# Patient Record
Sex: Female | Born: 1985 | Race: Black or African American | Hispanic: No | Marital: Single | Smoking: Former smoker
Health system: Southern US, Community
[De-identification: ages and names within clinical notes are randomized; demographics above are authoritative.]

## PROBLEM LIST (undated history)

## (undated) ENCOUNTER — Inpatient Hospital Stay (HOSPITAL_COMMUNITY): Payer: Self-pay

## (undated) DIAGNOSIS — R519 Headache, unspecified: Secondary | ICD-10-CM

## (undated) DIAGNOSIS — E669 Obesity, unspecified: Secondary | ICD-10-CM

## (undated) DIAGNOSIS — O139 Gestational [pregnancy-induced] hypertension without significant proteinuria, unspecified trimester: Secondary | ICD-10-CM

## (undated) DIAGNOSIS — R87619 Unspecified abnormal cytological findings in specimens from cervix uteri: Secondary | ICD-10-CM

## (undated) DIAGNOSIS — F419 Anxiety disorder, unspecified: Secondary | ICD-10-CM

## (undated) DIAGNOSIS — R51 Headache: Secondary | ICD-10-CM

## (undated) DIAGNOSIS — B009 Herpesviral infection, unspecified: Secondary | ICD-10-CM

## (undated) DIAGNOSIS — A6000 Herpesviral infection of urogenital system, unspecified: Secondary | ICD-10-CM

## (undated) HISTORY — DX: Headache: R51

## (undated) HISTORY — PX: WISDOM TOOTH EXTRACTION: SHX21

## (undated) HISTORY — DX: Headache, unspecified: R51.9

## (undated) HISTORY — DX: Unspecified abnormal cytological findings in specimens from cervix uteri: R87.619

---

## 2013-01-26 ENCOUNTER — Emergency Department (INDEPENDENT_AMBULATORY_CARE_PROVIDER_SITE_OTHER)
Admission: EM | Admit: 2013-01-26 | Discharge: 2013-01-26 | Disposition: A | Discharge status: Home / Self Care | Payer: 59 | Source: Home / Self Care | Attending: Family Medicine | Admitting: Family Medicine

## 2013-01-26 ENCOUNTER — Encounter: Payer: Self-pay | Admitting: *Deleted

## 2013-01-26 DIAGNOSIS — R109 Unspecified abdominal pain: Secondary | ICD-10-CM

## 2013-01-26 DIAGNOSIS — W57XXXA Bitten or stung by nonvenomous insect and other nonvenomous arthropods, initial encounter: Secondary | ICD-10-CM

## 2013-01-26 DIAGNOSIS — Z3490 Encounter for supervision of normal pregnancy, unspecified, unspecified trimester: Secondary | ICD-10-CM

## 2013-01-26 LAB — POCT URINALYSIS DIP (MANUAL ENTRY)
Blood, UA: NEGATIVE
Ketones, POC UA: NEGATIVE
Nitrite, UA: NEGATIVE
Protein Ur, POC: NEGATIVE
Spec Grav, UA: 1.02 (ref 1.005–1.03)
Urobilinogen, UA: 0.2 (ref 0–1)

## 2013-01-26 LAB — POCT URINE PREGNANCY: Preg Test, Ur: POSITIVE

## 2013-01-26 NOTE — ED Provider Notes (Signed)
CSN: [id number]     Arrival date & time 01/26/13  1056 History   First MD Initiated Contact with Patient 01/26/13 1109     Chief Complaint  Patient presents with  . Abdominal Pain  . Abscess    HPI  Patient presents today with chief complaint of intermittent severe abdominal pain x1 week. Patient reports intermittent lower, pain has been progressive he worsening over the course of the week. No fevers or chills. Patient also reports nausea. Patient denies any dysuria but has had some increased urinary frequency as well some questionable diarrhea. Bowel movements have been nonbilious nonbloody. No known trauma. Last mattress cycle is about 7-8 weeks ago. No recent infections. Abdominal pain has woken the patient up from sleep. Patient also has some redness and irritation on the dorsal aspect of her left forearm. Area has been itching. Patient is unsure this is an insect bite. No pain or purulent drainage.  History reviewed. No pertinent past medical history. History reviewed. No pertinent past surgical history. Family History  Problem Relation Age of Onset  . Heart failure Father   . Hypertension Father    History  Substance Use Topics  . Smoking status: Never Smoker   . Smokeless tobacco: Not on file  . Alcohol Use: No   OB History   Grav Para Term Preterm Abortions TAB SAB Ect Mult Living                 Review of Systems  All other systems reviewed and are negative.    Allergies  Review of patient's allergies indicates no known allergies.  Home Medications  No current outpatient prescriptions on file. BP 121/77  Pulse 79  Temp(Src) 98 F (36.7 C) (Oral)  Resp 18  Wt 209 lb (94.802 kg)  SpO2 98%  LMP 12/07/2012 Physical Exam  Constitutional:  Obese  HENT:  Head: Normocephalic and atraumatic.  Eyes: Conjunctivae are normal. Pupils are equal, round, and reactive to light.  Neck: Normal range of motion.  Cardiovascular: Normal rate and regular rhythm.     Pulmonary/Chest: Effort normal and breath sounds normal.  Abdominal: Soft. Bowel sounds are normal.  Positive tenderness palpation in the right lower left lower quadrant. Positive bowel sounds.  Neurological: She is alert.  Skin:     Mild focal area of erythema. Positive blanching. Nontender.    ED Course  Procedures (including critical care time) Labs Review Labs Reviewed  POCT URINE PREGNANCY  POCT URINALYSIS DIP (MANUAL ENTRY)   Imaging Review No results found.  MDM   1. Abdominal  pain, other specified site   2. Pregnancy   3. Insect bite    Urine pregnancy positive on testing today. Given constellation of intermittent progressive abdominal pain as well as pregnancy there is some concern for possible ectopic pregnancy on the differential diagnosis. Had a lengthy discussion with patient. [facility] as patient may benefit from a pelvic and transvaginal ultrasound. Patient feels comfortable to go under her own power. Left area rash consistent with insect bite. No signs of infection. Followup as needed.    The patient and/or caregiver has been counseled thoroughly with regard to treatment plan and/or medications prescribed including dosage, schedule, interactions, rationale for use, and possible side effects and they verbalize understanding. Diagnoses and expected course of recovery discussed and will return if not improved as expected or if the condition worsens. Patient and/or caregiver verbalized understanding.  Doree Albee, MD 01/26/13 1147

## 2013-01-26 NOTE — ED Notes (Signed)
Pt c/o generalized abd pain x 1wk with nausea. She also c/o frequent urination. Denies fever. She also c/o a bump on her LT forearm x 1 day.

## 2013-01-29 ENCOUNTER — Telehealth: Payer: Self-pay | Admitting: Emergency Medicine

## 2013-02-15 ENCOUNTER — Ambulatory Visit (INDEPENDENT_AMBULATORY_CARE_PROVIDER_SITE_OTHER): Payer: 59 | Admitting: Advanced Practice Midwife

## 2013-02-15 ENCOUNTER — Encounter: Payer: Self-pay | Admitting: Advanced Practice Midwife

## 2013-02-15 VITALS — BP 112/85 | Ht 62.0 in | Wt 212.0 lb

## 2013-02-15 DIAGNOSIS — Z8742 Personal history of other diseases of the female genital tract: Secondary | ICD-10-CM

## 2013-02-15 DIAGNOSIS — A6000 Herpesviral infection of urogenital system, unspecified: Secondary | ICD-10-CM

## 2013-02-15 DIAGNOSIS — O30009 Twin pregnancy, unspecified number of placenta and unspecified number of amniotic sacs, unspecified trimester: Secondary | ICD-10-CM

## 2013-02-15 DIAGNOSIS — Z87898 Personal history of other specified conditions: Secondary | ICD-10-CM | POA: Insufficient documentation

## 2013-02-15 DIAGNOSIS — A6009 Herpesviral infection of other urogenital tract: Secondary | ICD-10-CM

## 2013-02-15 DIAGNOSIS — Z113 Encounter for screening for infections with a predominantly sexual mode of transmission: Secondary | ICD-10-CM

## 2013-02-15 DIAGNOSIS — Z3491 Encounter for supervision of normal pregnancy, unspecified, first trimester: Secondary | ICD-10-CM

## 2013-02-15 DIAGNOSIS — O98519 Other viral diseases complicating pregnancy, unspecified trimester: Secondary | ICD-10-CM

## 2013-02-15 DIAGNOSIS — Z124 Encounter for screening for malignant neoplasm of cervix: Secondary | ICD-10-CM

## 2013-02-15 DIAGNOSIS — Z3480 Encounter for supervision of other normal pregnancy, unspecified trimester: Secondary | ICD-10-CM

## 2013-02-15 DIAGNOSIS — Z1151 Encounter for screening for human papillomavirus (HPV): Secondary | ICD-10-CM

## 2013-02-15 DIAGNOSIS — O30001 Twin pregnancy, unspecified number of placenta and unspecified number of amniotic sacs, first trimester: Secondary | ICD-10-CM

## 2013-02-15 MED ORDER — CONCEPT OB 130-92.4-1 MG PO CAPS: 1.0000 | ORAL_CAPSULE | Freq: Every day | ORAL | Status: DC

## 2013-02-15 MED ORDER — VALACYCLOVIR HCL 500 MG PO TABS: 500.0000 mg | ORAL_TABLET | Freq: Two times a day (BID) | ORAL | Status: DC

## 2013-02-15 NOTE — Progress Notes (Signed)
  Subjective:    Pamela Howard is being seen today for her first obstetrical visit.  This is not a planned pregnancy. She is at [redacted]w[redacted]d gestation by LMP, verified by 8 week 3 day ultrasound. [facility] bleeding. 2 gestational sacs seen. 1 with 8 week 3 day fetal pole and positive cardiac activity. Other sac empty. Her obstetrical history is significant for nothing. Relationship with FOB: significant other, living together. Patient does intend to breast feed. Pregnancy history fully reviewed.  Patient reports no complaints.  Review of Systems:   Review of Systems: Negative  Objective:     BP 112/85  Ht 5\' 2"  (1.575 m)  Wt 212 lb (96.163 kg)  BMI 38.77 kg/m2  LMP 12/07/2012 Physical Exam  Exam: General appearance - alert, well appearing, and in no distress, oriented to person, place, and time, overweight, playful, active and well hydrated Mouth - mucous membranes moist, pharynx normal without lesions and dental hygiene good Neck - supple, no significant adenopathy, thyroid exam: thyroid is normal in size without nodules or tenderness Heart - normal rate, regular rhythm, normal S1, S2, no murmurs, rubs, clicks or gallops Abdomen - soft, nontender, nondistended, no masses or organomegaly no CVA tenderness Breasts - breasts appear normal, no suspicious masses, no skin or nipple changes or axillary nodes Pelvic - normal external genitalia, vulva, vagina, cervix, uterus and adnexa, PAP: Pap smear done today Neurological - alert, oriented, normal speech, no focal findings or movement disorder noted, DTR's normal and symmetric Extremities - no edema, redness or tenderness in the calves or thighs     Assessment:    Pregnancy: G2P1001 Supervision of normal pregnancy in first trimester - Plan: Obstetric panel, HIV antibody, Sickle cell screen, Cystic fibrosis diagnostic study, Culture, OB Urine, Alcohol metabolite (ETG), urine, Prescript Monitor Profile(19),  GC/Chlamydia Probe Amp, Cytology - PAP, US Fetal Nuchal Translucency Measurement, Prenat w/o A Vit-FeFum-FePo-FA (CONCEPT OB) 130-92.4-1 MG CAPS  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 Prenatal vitamins. Problem list reviewed and updated. AFP3 discussed: ordered. CF screen ordered. Plans AFP. Role of ultrasound in pregnancy discussed; fetal survey: requested. Amniocentesis discussed: not indicated. Follow up in 4 weeks. 75% of 30 min visit spent on counseling and coordination of care.    Medication List       This list is accurate as of: 02/15/13 11:59 PM.  Always use your most recent med list.               CONCEPT OB 130-92.4-1 MG Caps  Take 1 tablet by mouth daily.     famotidine 20 MG tablet  Commonly known as:  PEPCID  20 mg.        valACYclovir 500 MG tablet  Commonly known as:  VALTREX  Take 1 tablet (500 mg total) by mouth 2 (two) times daily.        Dorathy Kinsman 02/15/2013

## 2013-02-15 NOTE — Patient Instructions (Signed)
Pregnancy - First Trimester  During sexual intercourse, millions of sperm go into the vagina. Only 1 sperm will penetrate and fertilize the female egg while it is in the Fallopian tube. One week later, the fertilized egg implants into the wall of the uterus. An embryo begins to develop into a baby. At 6 to 8 weeks, the eyes and face are formed and the heartbeat can be seen on ultrasound. At the end of 12 weeks (first trimester), all the baby's organs are formed. Now that you are pregnant, you will want to do everything you can to have a healthy baby. Two of the most important things are to get good prenatal care and follow your caregiver's instructions. Prenatal care is all the medical care you receive before the baby's birth. It is given to prevent, find, and treat problems during the pregnancy and childbirth.  PRENATAL EXAMS  · During prenatal visits, your weight, blood pressure, and urine are checked. This is done to make sure you are healthy and progressing normally during the pregnancy.  · A pregnant woman should gain 25 to 35 pounds during the pregnancy. However, if you are overweight or underweight, your caregiver will advise you regarding your weight.  · Your caregiver will ask and answer questions for you.  · Blood work, cervical cultures, other necessary tests, and a Pap test are done during your prenatal exams. These tests are done to check on your health and the probable health of your baby. Tests are strongly recommended and done for HIV with your permission. This is the virus that causes AIDS. These tests are done because medicines can be given to help prevent your baby from being born with this infection should you have been infected without knowing it. Blood work is also used to find out your blood type, previous infections, and follow your blood levels (hemoglobin).  · Low hemoglobin (anemia) is common during pregnancy. Iron and vitamins are given to help prevent this. Later in the pregnancy, blood  tests for diabetes will be done along with any other tests if any problems develop.  · You may need other tests to make sure you and the baby are doing well.  CHANGES DURING THE FIRST TRIMESTER   Your body goes through many changes during pregnancy. They vary from person to person. Talk to your caregiver about changes you notice and are concerned about. Changes can include:  · Your menstrual period stops.  · The egg and sperm carry the genes that determine what you look like. Genes from you and your partner are forming a baby. The female genes determine whether the baby is a boy or a girl.  · Your body increases in girth and you may feel bloated.  · Feeling sick to your stomach (nauseous) and throwing up (vomiting). If the vomiting is uncontrollable, call your caregiver.  · Your breasts will begin to enlarge and become tender.  · Your nipples may stick out more and become darker.  · The need to urinate more. Painful urination may mean you have a bladder infection.  · Tiring easily.  · Loss of appetite.  · Cravings for certain kinds of food.  · At first, you may gain or lose a couple of pounds.  · You may have changes in your emotions from day to day (excited to be pregnant or concerned something may go wrong with the pregnancy and baby).  · You may have more vivid and strange dreams.  HOME CARE INSTRUCTIONS   ·   It is very important to avoid all smoking, alcohol and non-prescribed drugs during your pregnancy. These affect the formation and growth of the baby. Avoid chemicals while pregnant to ensure the delivery of a healthy infant.  · Start your prenatal visits by the 12th week of pregnancy. They are usually scheduled monthly at first, then more often in the last 2 months before delivery. Keep your caregiver's appointments. Follow your caregiver's instructions regarding medicine use, blood and lab tests, exercise, and diet.  · During pregnancy, you are providing food for you and your baby. Eat regular, well-balanced  meals. Choose foods such as meat, fish, milk and other low fat dairy products, vegetables, fruits, and whole-grain breads and cereals. Your caregiver will tell you of the ideal weight gain.  · You can help morning sickness by keeping soda crackers at the bedside. Eat a couple before arising in the morning. You may want to use the crackers without salt on them.  · Eating 4 to 5 small meals rather than 3 large meals a day also may help the nausea and vomiting.  · Drinking liquids between meals instead of during meals also seems to help nausea and vomiting.  · A physical sexual relationship may be continued throughout pregnancy if there are no other problems. Problems may be early (premature) leaking of amniotic fluid from the membranes, vaginal bleeding, or belly (abdominal) pain.  · Exercise regularly if there are no restrictions. Check with your caregiver or physical therapist if you are unsure of the safety of some of your exercises. Greater weight gain will occur in the last 2 trimesters of pregnancy. Exercising will help:  · Control your weight.  · Keep you in shape.  · Prepare you for labor and delivery.  · Help you lose your pregnancy weight after you deliver your baby.  · Wear a good support or jogging bra for breast tenderness during pregnancy. This may help if worn during sleep too.  · Ask when prenatal classes are available. Begin classes when they are offered.  · Do not use hot tubs, steam rooms, or saunas.  · Wear your seat belt when driving. This protects you and your baby if you are in an accident.  · Avoid raw meat, uncooked cheese, cat litter boxes, and soil used by cats throughout the pregnancy. These carry germs that can cause birth defects in the baby.  · The first trimester is a good time to visit your dentist for your dental health. Getting your teeth cleaned is okay. Use a softer toothbrush and brush gently during pregnancy.  · Ask for help if you have financial, counseling, or nutritional needs  during pregnancy. Your caregiver will be able to offer counseling for these needs as well as refer you for other special needs.  · Do not take any medicines or herbs unless told by your caregiver.  · Inform your caregiver if there is any mental or physical domestic violence.  · Make a list of emergency phone numbers of family, friends, hospital, and police and fire departments.  · Write down your questions. Take them to your prenatal visit.  · Do not douche.  · Do not cross your legs.  · If you have to stand for long periods of time, rotate you feet or take small steps in a circle.  · You may have more vaginal secretions that may require a sanitary pad. Do not use tampons or scented sanitary pads.  MEDICINES AND DRUG USE IN PREGNANCY  ·   Take prenatal vitamins as directed. The vitamin should contain 1 milligram of folic acid. Keep all vitamins out of reach of children. Only a couple vitamins or tablets containing iron may be fatal to a baby or young child when ingested.  · Avoid use of all medicines, including herbs, over-the-counter medicines, not prescribed or suggested by your caregiver. Only take over-the-counter or prescription medicines for pain, discomfort, or fever as directed by your caregiver. Do not use aspirin, ibuprofen, or naproxen unless directed by your caregiver.  · Let your caregiver also know about herbs you may be using.  · Alcohol is related to a number of birth defects. This includes fetal alcohol syndrome. All alcohol, in any form, should be avoided completely. Smoking will cause low birth rate and premature babies.  · Street or illegal drugs are very harmful to the baby. They are absolutely forbidden. A baby born to an addicted mother will be addicted at birth. The baby will go through the same withdrawal an adult does.  · Let your caregiver know about any medicines that you have to take and for what reason you take them.  SEEK MEDICAL CARE IF:   You have any concerns or worries during your  pregnancy. It is better to call with your questions if you feel they cannot wait, rather than worry about them.  SEEK IMMEDIATE MEDICAL CARE IF:   · An unexplained oral temperature above 102° F (38.9° C) develops, or as your caregiver suggests.  · You have leaking of fluid from the vagina (birth canal). If leaking membranes are suspected, take your temperature and inform your caregiver of this when you call.  · There is vaginal spotting or bleeding. Notify your caregiver of the amount and how many pads are used.  · You develop a bad smelling vaginal discharge with a change in the color.  · You continue to feel sick to your stomach (nauseated) and have no relief from remedies suggested. You vomit blood or coffee ground-like materials.  · You lose more than 2 pounds of weight in 1 week.  · You gain more than 2 pounds of weight in 1 week and you notice swelling of your face, hands, feet, or legs.  · You gain 5 pounds or more in 1 week (even if you do not have swelling of your hands, face, legs, or feet).  · You get exposed to German measles and have never had them.  · You are exposed to fifth disease or chickenpox.  · You develop belly (abdominal) pain. Round ligament discomfort is a common non-cancerous (benign) cause of abdominal pain in pregnancy. Your caregiver still must evaluate this.  · You develop headache, fever, diarrhea, pain with urination, or shortness of breath.  · You fall or are in a car accident or have any kind of trauma.  · There is mental or physical violence in your home.  Document Released: 04/09/2001 Document Revised: 01/08/2012 Document Reviewed: 10/11/2008  [employer]® Patient Information ©2014 [employer].

## 2013-02-15 NOTE — Progress Notes (Signed)
P - 89 - Pt was seen at [facility] on 02/01/13 for Pelvic Pain - I have rcvd the U/S and Lab results from [facility] and have scanned them into system - Should be able to see results under Media/Lab tabs

## 2013-02-16 LAB — OBSTETRIC PANEL
Antibody Screen: NEGATIVE
Basophils Absolute: 0 10*3/uL (ref 0.0–0.1)
Eosinophils Absolute: 0.1 10*3/uL (ref 0.0–0.7)
Eosinophils Relative: 1 % (ref 0–5)
Hemoglobin: 14 g/dL (ref 12.0–15.0)
MCH: 30.7 pg (ref 26.0–34.0)
MCV: 91.7 fL (ref 78.0–100.0)
Neutrophils Relative %: 64 % (ref 43–77)
Platelets: 340 10*3/uL (ref 150–400)
RDW: 14 % (ref 11.5–15.5)
WBC: 6.9 10*3/uL (ref 4.0–10.5)

## 2013-02-16 LAB — GC/CHLAMYDIA PROBE AMP: GC Probe RNA: NEGATIVE

## 2013-02-16 LAB — HIV ANTIBODY (ROUTINE TESTING W REFLEX): HIV: NONREACTIVE

## 2013-02-16 LAB — SICKLE CELL SCREEN: Sickle Cell Screen: NEGATIVE

## 2013-02-17 LAB — PRESCRIPTION MONITORING PROFILE (19 PANEL)
Amphetamine/Meth: NEGATIVE ng/mL
Barbiturate Screen, Urine: NEGATIVE ng/mL
Benzodiazepine Screen, Urine: NEGATIVE ng/mL
Buprenorphine, Urine: NEGATIVE ng/mL
Carisoprodol, Urine: NEGATIVE ng/mL
MDMA URINE: NEGATIVE ng/mL
Methadone Screen, Urine: NEGATIVE ng/mL
Methaqualone: NEGATIVE ng/mL
Nitrites, Initial: NEGATIVE ug/mL
Opiate Screen, Urine: NEGATIVE ng/mL
Oxycodone Screen, Ur: NEGATIVE ng/mL
Propoxyphene: NEGATIVE ng/mL
Tapentadol, urine: NEGATIVE ng/mL
Tramadol Scrn, Ur: NEGATIVE ng/mL
Zolpidem, Urine: NEGATIVE ng/mL
pH, Initial: 5.9 pH (ref 4.5–8.9)

## 2013-02-17 LAB — CYSTIC FIBROSIS DIAGNOSTIC STUDY

## 2013-02-17 LAB — CANNABANOIDS (GC/LC/MS), URINE: THC-COOH (GC/LC/MS), ur confirm: 33 ng/mL — AB

## 2013-02-17 LAB — CULTURE, OB URINE: Colony Count: 15000

## 2013-02-18 ENCOUNTER — Encounter: Payer: Self-pay | Admitting: Advanced Practice Midwife

## 2013-02-18 DIAGNOSIS — F1290 Cannabis use, unspecified, uncomplicated: Secondary | ICD-10-CM | POA: Insufficient documentation

## 2013-02-18 DIAGNOSIS — A6009 Herpesviral infection of other urogenital tract: Secondary | ICD-10-CM | POA: Insufficient documentation

## 2013-02-18 DIAGNOSIS — Z3490 Encounter for supervision of normal pregnancy, unspecified, unspecified trimester: Secondary | ICD-10-CM | POA: Insufficient documentation

## 2013-02-22 ENCOUNTER — Telehealth: Payer: Self-pay | Admitting: *Deleted

## 2013-02-22 DIAGNOSIS — B373 Candidiasis of vulva and vagina: Secondary | ICD-10-CM

## 2013-02-22 MED ORDER — TERCONAZOLE 0.4 % VA CREA: 1.0000 | TOPICAL_CREAM | Freq: Every day | VAGINAL | Status: DC

## 2013-02-22 NOTE — Telephone Encounter (Signed)
Message copied by Granville Lewis on Mon Feb 22, 2013 11:01 AM ------      Message from: Dorathy Kinsman      Created: Sat Feb 20, 2013  4:31 AM       Pap shows pos Candida. Offer pt Terazol 7 PV as needed if symptomatic. ------

## 2013-02-22 NOTE — Telephone Encounter (Signed)
LM on voicemail  About positive Candida and RX sent to [facility] for Terazol cream per Ivonne Andrew CNM

## 2013-03-03 ENCOUNTER — Ambulatory Visit (HOSPITAL_COMMUNITY): Payer: 59

## 2013-03-08 ENCOUNTER — Ambulatory Visit (HOSPITAL_COMMUNITY)
Admission: RE | Admit: 2013-03-08 | Discharge: 2013-03-08 | Disposition: A | Discharge status: Home / Self Care | Payer: 59 | Source: Ambulatory Visit | Attending: Advanced Practice Midwife | Admitting: Advanced Practice Midwife

## 2013-03-08 VITALS — BP 114/75 | Wt 205.0 lb

## 2013-03-08 DIAGNOSIS — F1290 Cannabis use, unspecified, uncomplicated: Secondary | ICD-10-CM

## 2013-03-08 DIAGNOSIS — Z3491 Encounter for supervision of normal pregnancy, unspecified, first trimester: Secondary | ICD-10-CM

## 2013-03-08 DIAGNOSIS — O351XX0 Maternal care for (suspected) chromosomal abnormality in fetus, not applicable or unspecified: Secondary | ICD-10-CM | POA: Insufficient documentation

## 2013-03-08 DIAGNOSIS — O3510X0 Maternal care for (suspected) chromosomal abnormality in fetus, unspecified, not applicable or unspecified: Secondary | ICD-10-CM | POA: Insufficient documentation

## 2013-03-08 DIAGNOSIS — Z3689 Encounter for other specified antenatal screening: Secondary | ICD-10-CM | POA: Insufficient documentation

## 2013-03-08 NOTE — Progress Notes (Signed)
Denicia Knoche  was seen today for an ultrasound appointment.  See full report in [facility].  Impression: Single IUP at 13 0/7 weeks Normal NT (1.5 mm), Nasal bone present First trimester aneuploidy screen performed as noted above.   Recommendations: Please do not draw triple/quad screen, though patient should be offered MSAFP for neural tube defect screening.  Recommend ultrasound for fetal anatomy at approximately 18 weeks.  Alpha Gula, MD

## 2013-03-12 ENCOUNTER — Other Ambulatory Visit: Payer: Self-pay

## 2013-03-15 ENCOUNTER — Encounter: Payer: 59 | Admitting: Advanced Practice Midwife

## 2013-03-16 ENCOUNTER — Encounter: Payer: Self-pay | Admitting: Advanced Practice Midwife

## 2013-03-16 ENCOUNTER — Encounter: Payer: Self-pay | Admitting: *Deleted

## 2013-03-21 ENCOUNTER — Encounter (HOSPITAL_COMMUNITY): Payer: Self-pay | Admitting: *Deleted

## 2013-03-21 ENCOUNTER — Inpatient Hospital Stay (HOSPITAL_COMMUNITY)
Admission: AD | Admit: 2013-03-21 | Discharge: 2013-03-21 | Disposition: A | Discharge status: Home / Self Care | Payer: 59 | Source: Ambulatory Visit | Attending: Obstetrics & Gynecology | Admitting: Obstetrics & Gynecology

## 2013-03-21 DIAGNOSIS — O26899 Other specified pregnancy related conditions, unspecified trimester: Secondary | ICD-10-CM

## 2013-03-21 DIAGNOSIS — R109 Unspecified abdominal pain: Secondary | ICD-10-CM | POA: Insufficient documentation

## 2013-03-21 DIAGNOSIS — O99891 Other specified diseases and conditions complicating pregnancy: Secondary | ICD-10-CM | POA: Insufficient documentation

## 2013-03-21 LAB — URINALYSIS, ROUTINE W REFLEX MICROSCOPIC
Bilirubin Urine: NEGATIVE
Glucose, UA: NEGATIVE mg/dL
Ketones, ur: NEGATIVE mg/dL
Nitrite: NEGATIVE
Specific Gravity, Urine: 1.025 (ref 1.005–1.030)
Urobilinogen, UA: 1 mg/dL (ref 0.0–1.0)

## 2013-03-21 LAB — POCT PREGNANCY, URINE: Preg Test, Ur: POSITIVE — AB

## 2013-03-21 NOTE — MAU Provider Note (Signed)
History     CSN: [id number]  Arrival date and time: 03/21/13 2124   First Provider Initiated Contact with Patient 03/21/13 2211      Chief Complaint  Patient presents with  . Abdominal Cramping   HPI  Pt is a 27 yo G2P1001 at [redacted]w[redacted]d wk IUP here with report of lower abdominal pain that started last night.  Pain is in the lower pelvis, radiates to left side, and described as cramping.  Pain increases with coughing.  No report of vaginal bleeding or leaking of fluid.  Denies UTI symptoms or abnormal vaginal discharge.    History reviewed. No pertinent past medical history.  History reviewed. No pertinent past surgical history.  Family History  Problem Relation Age of Onset  . Heart failure Father   . Hypertension Father     History  Substance Use Topics  . Smoking status: Never Smoker   . Smokeless tobacco: Not on file  . Alcohol Use: No    Allergies: No Known Allergies  Prescriptions prior to admission  Medication Sig Dispense Refill  . acetaminophen (TYLENOL) 500 MG tablet Take 1,000 mg by mouth every 6 (six) hours as needed for mild pain.      Burnis Medin w/o A Vit-FeFum-FePo-FA (CONCEPT OB) 130-92.4-1 MG CAPS Take 1 tablet by mouth daily.  30 capsule  12    Review of Systems  Gastrointestinal: Positive for abdominal pain (cramping).  Genitourinary: Negative for dysuria, urgency and frequency.  Musculoskeletal: Negative for back pain.  All other systems reviewed and are negative.   Physical Exam   Blood pressure 128/72, pulse 92, temperature 98.6 F (37 C), temperature source Oral, resp. rate 20, last menstrual period 12/07/2012.  Physical Exam  Constitutional: She is oriented to person, place, and time. She appears well-developed and well-nourished. No distress.  HENT:  Head: Normocephalic.  Neck: Normal range of motion. Neck supple.  Cardiovascular: Normal rate, regular rhythm and normal heart sounds.   Respiratory: Effort normal and breath sounds normal.    GI: Soft. She exhibits no mass. There is tenderness (LUQ) in the left upper quadrant.    Genitourinary: No bleeding around the vagina. Vaginal discharge (mucusy ) found.  Musculoskeletal: Normal range of motion. She exhibits no edema.  Neurological: She is alert and oriented to person, place, and time.  Skin: Skin is warm and dry.     Dilation: Closed Effacement (%): Thick Cervical Position: Posterior Exam by:: Roney Marion, CNM  MAU Course  Procedures Results for orders placed during the hospital encounter of 03/21/13 (from the past 24 hour(s))  URINALYSIS, ROUTINE W REFLEX MICROSCOPIC     Status: None   Collection Time    03/21/13  9:43 PM      Result Value Range   Color, Urine YELLOW  YELLOW   APPearance CLEAR  CLEAR   Specific Gravity, Urine 1.025  1.005 - 1.030   pH 6.5  5.0 - 8.0   Glucose, UA NEGATIVE  NEGATIVE mg/dL   Hgb urine dipstick NEGATIVE  NEGATIVE   Bilirubin Urine NEGATIVE  NEGATIVE   Ketones, ur NEGATIVE  NEGATIVE mg/dL   Protein, ur NEGATIVE  NEGATIVE mg/dL   Urobilinogen, UA 1.0  0.0 - 1.0 mg/dL   Nitrite NEGATIVE  NEGATIVE   Leukocytes, UA NEGATIVE  NEGATIVE  POCT PREGNANCY, URINE     Status: Abnormal   Collection Time    03/21/13  9:47 PM      Result Value Range   Preg Test, Ur  POSITIVE (*) NEGATIVE   FHR 150  Assessment and Plan  27 yo G2P1001 at [redacted]w[redacted]d wks IUP Abdominal Pain - Normal Exam  Plan: Discharge to home Pregnancy precautions reviewed Keep scheduled appointment for this week.  [facility] 03/21/2013, 10:14 PM

## 2013-03-21 NOTE — MAU Note (Signed)
Abdominal cramping off and on since last night. Denies vaginal bleeding, leaking of fluid and abnormal vaginal discharge.

## 2013-03-22 NOTE — MAU Provider Note (Signed)
Attestation of Attending Supervision of Advanced Practitioner (CNM/NP): Evaluation and management procedures were performed by the Advanced Practitioner under my supervision and collaboration. I have reviewed the Advanced Practitioner's note and chart, and I agree with the management and plan.  Jozie Wulf H. 6:23 AM   

## 2013-03-23 ENCOUNTER — Ambulatory Visit (INDEPENDENT_AMBULATORY_CARE_PROVIDER_SITE_OTHER): Payer: 59 | Admitting: Obstetrics & Gynecology

## 2013-03-23 VITALS — BP 122/75 | Wt 211.0 lb

## 2013-03-23 DIAGNOSIS — Z3402 Encounter for supervision of normal first pregnancy, second trimester: Secondary | ICD-10-CM

## 2013-03-23 DIAGNOSIS — Z3480 Encounter for supervision of other normal pregnancy, unspecified trimester: Secondary | ICD-10-CM

## 2013-03-23 DIAGNOSIS — Z3492 Encounter for supervision of normal pregnancy, unspecified, second trimester: Secondary | ICD-10-CM

## 2013-03-23 MED ORDER — RANITIDINE HCL 150 MG PO TABS: 150.0000 mg | ORAL_TABLET | Freq: Two times a day (BID) | ORAL | Status: DC

## 2013-03-23 MED ORDER — VALACYCLOVIR HCL 500 MG PO TABS: 500.0000 mg | ORAL_TABLET | Freq: Every day | ORAL | Status: DC

## 2013-03-23 MED ORDER — FLUCONAZOLE 100 MG PO TABS: 150.0000 mg | ORAL_TABLET | Freq: Every day | ORAL | Status: DC

## 2013-03-23 NOTE — Progress Notes (Signed)
p-95  Increase in headaches since pregnancy.  Pain and numbness in Rt butt cheek.

## 2013-03-23 NOTE — Progress Notes (Signed)
AFP 

## 2013-03-23 NOTE — Addendum Note (Signed)
Addended by: Granville Lewis on: 03/23/2013 04:38 PM   Modules accepted: Orders

## 2013-03-29 LAB — ALPHA FETOPROTEIN, MATERNAL
MoM for AFP: 1.15
Open Spina bifida: NEGATIVE

## 2013-03-30 ENCOUNTER — Ambulatory Visit (INDEPENDENT_AMBULATORY_CARE_PROVIDER_SITE_OTHER): Payer: 59 | Admitting: Nurse Practitioner

## 2013-03-30 ENCOUNTER — Encounter: Payer: Self-pay | Admitting: Nurse Practitioner

## 2013-03-30 ENCOUNTER — Encounter: Payer: Self-pay | Admitting: Obstetrics & Gynecology

## 2013-03-30 ENCOUNTER — Institutional Professional Consult (permissible substitution): Payer: 59 | Admitting: Nurse Practitioner

## 2013-03-30 VITALS — BP 115/74 | HR 96 | Resp 16 | Wt 212.0 lb

## 2013-03-30 DIAGNOSIS — F419 Anxiety disorder, unspecified: Secondary | ICD-10-CM | POA: Insufficient documentation

## 2013-03-30 DIAGNOSIS — G43009 Migraine without aura, not intractable, without status migrainosus: Secondary | ICD-10-CM | POA: Insufficient documentation

## 2013-03-30 DIAGNOSIS — F411 Generalized anxiety disorder: Secondary | ICD-10-CM

## 2013-03-30 MED ORDER — PROMETHAZINE HCL 25 MG PO TABS: 25.0000 mg | ORAL_TABLET | Freq: Four times a day (QID) | ORAL | Status: DC | PRN

## 2013-03-30 MED ORDER — SUMATRIPTAN SUCCINATE 100 MG PO TABS: 100.0000 mg | ORAL_TABLET | ORAL | Status: DC | PRN

## 2013-03-30 MED ORDER — CYCLOBENZAPRINE HCL 10 MG PO TABS: 10.0000 mg | ORAL_TABLET | Freq: Two times a day (BID) | ORAL | Status: DC | PRN

## 2013-03-30 NOTE — Progress Notes (Signed)
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 She has had migraines since her teen years. Her grandmother and sister have migraines. She denies aura. She has vertigo with her migraines. 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 She lives with and has a 55 yo daughter with the FOB.  She is often stressed with life events. Her migraines have been everyday since she was [redacted] weeks pregnant.    Location: Right and left temple  Number of Headache days/month: Daily Severe: 2 Moderate: everyday Mild:0  Current Outpatient Prescriptions on File Prior to Visit  Medication Sig Dispense Refill  . acetaminophen (TYLENOL) 500 MG tablet Take 1,000 mg by mouth every 6 (six) hours as needed for mild pain.      . fluconazole (DIFLUCAN) 100 MG tablet Take 1.5 tablets (150 mg total) by mouth daily.  1 tablet  0  . Prenat w/o A Vit-FeFum-FePo-FA (CONCEPT OB) 130-92.4-1 MG CAPS Take 1 tablet by mouth daily.  30 capsule  12  . ranitidine (ZANTAC) 150 MG tablet Take 1 tablet (150 mg total) by mouth 2 (two) times daily.  60 tablet  1  . valACYclovir (VALTREX) 500 MG tablet Take 1 tablet (500 mg total) by mouth daily. Can increase to twice a day for 5 days in the event of a recurrence  30 tablet  12   No current facility-administered medications on file prior to visit.    Acute / prevention: Motrin, tylenol,   Past Medical History  Diagnosis Date  . Headache    History reviewed. No pertinent past surgical history. Family History  Problem Relation Age of Onset  . Heart failure Father   . Hypertension Father   . Migraines Sister   . Migraines Paternal Grandmother    Social History:  reports that she has never smoked. She does not have any smokeless tobacco history on file. She reports that she does not drink alcohol or use illicit drugs. Allergies: No Known  Allergies  Triggers: Heat, stress, allergies seasonal  Birth control: Nexplanon after delivery  ROS: Positive for migraine, anxiety, seasonal allergies, nausea, sciatica pains, obesoty, pregnancy  Exam: Well developed 27 YO AA Female who is pregnant  General: NAD HEENT: Negative Cardiac: RRR Lungs: Clear Neuro: Negative Skin: Warm and dry  Procedure: 2cc lidocaine, 2 cc marcaine, 1 cc dexamethazone. Injected with 1cc each site in Bil Occipital. Pt tolerated procedure well.    Impression: chronic migraine in pregnancy  Plan: Discussed the pathophysiology of migraine and risk benefits of medications. We have decided upon Flexeril for prevention which may also help sciatic pains. She will use Imitrex and phenergan when she gets a migraine. We used trigger point injections today to help bridge her over. She had relief from her migraine after TPI.  We discussed ways to control her anxiety that are nonmedication in nature. She will RTC 4 weeks or prn.   Time Spent: 45 minutes

## 2013-03-30 NOTE — Patient Instructions (Signed)
Migraine Headache A migraine headache is an intense, throbbing pain on one or both sides of your head. A migraine can last for 30 minutes to several hours. CAUSES  The exact cause of a migraine headache is not always known. However, a migraine may be caused when nerves in the brain become irritated and release chemicals that cause inflammation. This causes pain. SYMPTOMS  Pain on one or both sides of your head.  Pulsating or throbbing pain.  Severe pain that prevents daily activities.  Pain that is aggravated by any physical activity.  Nausea, vomiting, or both.  Dizziness.  Pain with exposure to bright lights, loud noises, or activity.  General sensitivity to bright lights, loud noises, or smells. Before you get a migraine, you may get warning signs that a migraine is coming (aura). An aura may include:  Seeing flashing lights.  Seeing bright spots, halos, or zig-zag lines.  Having tunnel vision or blurred vision.  Having feelings of numbness or tingling.  Having trouble talking.  Having muscle weakness. MIGRAINE TRIGGERS  Alcohol.  Smoking.  Stress.  Menstruation.  Aged cheeses.  Foods or drinks that contain nitrates, glutamate, aspartame, or tyramine.  Lack of sleep.  Chocolate.  Caffeine.  Hunger.  Physical exertion.  Fatigue.  Medicines used to treat chest pain (nitroglycerine), birth control pills, estrogen, and some blood pressure medicines. DIAGNOSIS  A migraine headache is often diagnosed based on:  Symptoms.  Physical examination.  A CT scan or MRI of your head. TREATMENT Medicines may be given for pain and nausea. Medicines can also be given to help prevent recurrent migraines.  HOME CARE INSTRUCTIONS  Only take over-the-counter or prescription medicines for pain or discomfort as directed by your caregiver. The use of long-term narcotics is not recommended.  Lie down in a dark, quiet room when you have a migraine.  Keep a journal  to find out what may trigger your migraine headaches. For example, write down:  What you eat and drink.  How much sleep you get.  Any change to your diet or medicines.  Limit alcohol consumption.  Quit smoking if you smoke.  Get 7 to 9 hours of sleep, or as recommended by your caregiver.  Limit stress.  Keep lights dim if bright lights bother you and make your migraines worse. SEEK IMMEDIATE MEDICAL CARE IF:   Your migraine becomes severe.  You have a fever.  You have a stiff neck.  You have vision loss.  You have muscular weakness or loss of muscle control.  You start losing your balance or have trouble walking.  You feel faint or pass out.  You have severe symptoms that are different from your first symptoms. MAKE SURE YOU:   Understand these instructions.  Will watch your condition.  Will get help right away if you are not doing well or get worse. Document Released: 04/15/2005 Document Revised: 07/08/2011 Document Reviewed: 04/05/2011 ExitCare Patient Information 2014 [employer].  

## 2013-03-31 ENCOUNTER — Telehealth: Payer: Self-pay | Admitting: *Deleted

## 2013-03-31 DIAGNOSIS — Z3481 Encounter for supervision of other normal pregnancy, first trimester: Secondary | ICD-10-CM

## 2013-03-31 MED ORDER — CONCEPT OB 130-92.4-1 MG PO CAPS: 1.0000 | ORAL_CAPSULE | Freq: Every day | ORAL | Status: DC

## 2013-03-31 NOTE — Telephone Encounter (Signed)
Pt called adv needs prenatal vitamins sent to [facility] - will cancel current order and place new order with correct pharm

## 2013-04-12 ENCOUNTER — Ambulatory Visit (HOSPITAL_COMMUNITY)
Admission: RE | Admit: 2013-04-12 | Discharge: 2013-04-12 | Disposition: A | Discharge status: Home / Self Care | Payer: 59 | Source: Ambulatory Visit | Attending: Obstetrics & Gynecology | Admitting: Obstetrics & Gynecology

## 2013-04-12 ENCOUNTER — Other Ambulatory Visit: Payer: Self-pay | Admitting: Obstetrics & Gynecology

## 2013-04-12 VITALS — BP 116/70 | HR 108 | Wt 212.0 lb

## 2013-04-12 DIAGNOSIS — Z3689 Encounter for other specified antenatal screening: Secondary | ICD-10-CM

## 2013-04-12 DIAGNOSIS — Z3492 Encounter for supervision of normal pregnancy, unspecified, second trimester: Secondary | ICD-10-CM

## 2013-04-12 DIAGNOSIS — F1290 Cannabis use, unspecified, uncomplicated: Secondary | ICD-10-CM

## 2013-04-13 ENCOUNTER — Encounter: Payer: Self-pay | Admitting: Obstetrics & Gynecology

## 2013-04-26 ENCOUNTER — Encounter: Payer: 59 | Admitting: Advanced Practice Midwife

## 2013-04-28 ENCOUNTER — Ambulatory Visit (INDEPENDENT_AMBULATORY_CARE_PROVIDER_SITE_OTHER): Payer: 59 | Admitting: Obstetrics & Gynecology

## 2013-04-28 ENCOUNTER — Encounter: Payer: Self-pay | Admitting: Obstetrics & Gynecology

## 2013-04-28 VITALS — BP 103/70 | Temp 97.9°F | Wt 211.0 lb

## 2013-04-28 DIAGNOSIS — Z3480 Encounter for supervision of other normal pregnancy, unspecified trimester: Secondary | ICD-10-CM

## 2013-04-28 DIAGNOSIS — Z3492 Encounter for supervision of normal pregnancy, unspecified, second trimester: Secondary | ICD-10-CM

## 2013-04-28 DIAGNOSIS — R0989 Other specified symptoms and signs involving the circulatory and respiratory systems: Secondary | ICD-10-CM

## 2013-04-28 NOTE — Progress Notes (Signed)
p=[phone] 

## 2013-04-28 NOTE — Patient Instructions (Signed)
Return to clinic for any obstetric concerns or go to [facility] for evaluation  

## 2013-04-28 NOTE — Progress Notes (Signed)
Reports respiratory symptoms, fevers to 103 at home.  Here temperature is 97.9.  Lungs are clear on exam. She was told to return to Urgent care for any further fevers as she may need testing for Flu and further treatment.  For now, drinking plenty of fluids and OTC meds recommended for symptomatic relief.  No other complaints or concerns.  Routine obstetric precautions reviewed.

## 2013-05-04 ENCOUNTER — Encounter: Payer: 59 | Admitting: Nurse Practitioner

## 2013-05-24 ENCOUNTER — Encounter: Payer: Self-pay | Admitting: Advanced Practice Midwife

## 2013-05-24 ENCOUNTER — Ambulatory Visit (INDEPENDENT_AMBULATORY_CARE_PROVIDER_SITE_OTHER): Payer: 59 | Admitting: Advanced Practice Midwife

## 2013-05-24 VITALS — BP 132/73 | Wt 217.0 lb

## 2013-05-24 DIAGNOSIS — Z3480 Encounter for supervision of other normal pregnancy, unspecified trimester: Secondary | ICD-10-CM

## 2013-05-24 DIAGNOSIS — Z3491 Encounter for supervision of normal pregnancy, unspecified, first trimester: Secondary | ICD-10-CM

## 2013-05-24 NOTE — Progress Notes (Signed)
p-[phone]

## 2013-05-24 NOTE — Patient Instructions (Signed)
Second Trimester of Pregnancy The second trimester is from week 13 through week 28, months 4 through 6. The second trimester is often a time when you feel your best. Your body has also adjusted to being pregnant, and you begin to feel better physically. Usually, morning sickness has lessened or quit completely, you may have more energy, and you may have an increase in appetite. The second trimester is also a time when the fetus is growing rapidly. At the end of the sixth month, the fetus is about 9 inches long and weighs about 1 pounds. You will likely begin to feel the baby move (quickening) between 18 and 20 weeks of the pregnancy. BODY CHANGES Your body goes through many changes during pregnancy. The changes vary from woman to woman.   Your weight will continue to increase. You will notice your lower abdomen bulging out.  You may begin to get stretch marks on your hips, abdomen, and breasts.  You may develop headaches that can be relieved by medicines approved by your caregiver.  You may urinate more often because the fetus is pressing on your bladder.  You may develop or continue to have heartburn as a result of your pregnancy.  You may develop constipation because certain hormones are causing the muscles that push waste through your intestines to slow down.  You may develop hemorrhoids or swollen, bulging veins (varicose veins).  You may have back pain because of the weight gain and pregnancy hormones relaxing your joints between the bones in your pelvis and as a result of a shift in weight and the muscles that support your balance.  Your breasts will continue to grow and be tender.  Your gums may bleed and may be sensitive to brushing and flossing.  Dark spots or blotches (chloasma, mask of pregnancy) may develop on your face. This will likely fade after the baby is born.  A dark line from your belly button to the pubic area (linea nigra) may appear. This will likely fade after the  baby is born. WHAT TO EXPECT AT YOUR PRENATAL VISITS During a routine prenatal visit:  You will be weighed to make sure you and the fetus are growing normally.  Your blood pressure will be taken.  Your abdomen will be measured to track your baby's growth.  The fetal heartbeat will be listened to.  Any test results from the previous visit will be discussed. Your caregiver may ask you:  How you are feeling.  If you are feeling the baby move.  If you have had any abnormal symptoms, such as leaking fluid, bleeding, severe headaches, or abdominal cramping.  If you have any questions. Other tests that may be performed during your second trimester include:  Blood tests that check for:  Low iron levels (anemia).  Gestational diabetes (between 24 and 28 weeks).  Rh antibodies.  Urine tests to check for infections, diabetes, or protein in the urine.  An ultrasound to confirm the proper growth and development of the baby.  An amniocentesis to check for possible genetic problems.  Fetal screens for spina bifida and Down syndrome. HOME CARE INSTRUCTIONS   Avoid all smoking, herbs, alcohol, and unprescribed drugs. These chemicals affect the formation and growth of the baby.  Follow your caregiver's instructions regarding medicine use. There are medicines that are either safe or unsafe to take during pregnancy.  Exercise only as directed by your caregiver. Experiencing uterine cramps is a good sign to stop exercising.  Continue to eat regular,   healthy meals.  Wear a good support bra for breast tenderness.  Do not use hot tubs, steam rooms, or saunas.  Wear your seat belt at all times when driving.  Avoid raw meat, uncooked cheese, cat litter boxes, and soil used by cats. These carry germs that can cause birth defects in the baby.  Take your prenatal vitamins.  Try taking a stool softener (if your caregiver approves) if you develop constipation. Eat more high-fiber foods,  such as fresh vegetables or fruit and whole grains. Drink plenty of fluids to keep your urine clear or pale yellow.  Take warm sitz baths to soothe any pain or discomfort caused by hemorrhoids. Use hemorrhoid cream if your caregiver approves.  If you develop varicose veins, wear support hose. Elevate your feet for 15 minutes, 3 4 times a day. Limit salt in your diet.  Avoid heavy lifting, wear low heel shoes, and practice good posture.  Rest with your legs elevated if you have leg cramps or low back pain.  Visit your dentist if you have not gone yet during your pregnancy. Use a soft toothbrush to brush your teeth and be gentle when you floss.  A sexual relationship may be continued unless your caregiver directs you otherwise.  Continue to go to all your prenatal visits as directed by your caregiver. SEEK MEDICAL CARE IF:   You have dizziness.  You have mild pelvic cramps, pelvic pressure, or nagging pain in the abdominal area.  You have persistent nausea, vomiting, or diarrhea.  You have a bad smelling vaginal discharge.  You have pain with urination. SEEK IMMEDIATE MEDICAL CARE IF:   You have a fever.  You are leaking fluid from your vagina.  You have spotting or bleeding from your vagina.  You have severe abdominal cramping or pain.  You have rapid weight gain or loss.  You have shortness of breath with chest pain.  You notice sudden or extreme swelling of your face, hands, ankles, feet, or legs.  You have not felt your baby move in over an hour.  You have severe headaches that do not go away with medicine.  You have vision changes. Document Released: 04/09/2001 Document Revised: 12/16/2012 Document Reviewed: 06/16/2012 ExitCare Patient Information 2014 [employer].  

## 2013-05-24 NOTE — Progress Notes (Signed)
Doing well. Lots of fetal movement. Moving, so life is hectic right now.

## 2013-06-01 ENCOUNTER — Encounter: Payer: Self-pay | Admitting: Nurse Practitioner

## 2013-06-01 ENCOUNTER — Ambulatory Visit (INDEPENDENT_AMBULATORY_CARE_PROVIDER_SITE_OTHER): Payer: 59 | Admitting: Nurse Practitioner

## 2013-06-01 ENCOUNTER — Other Ambulatory Visit: Payer: Self-pay | Admitting: *Deleted

## 2013-06-01 VITALS — BP 119/77 | HR 104 | Resp 16 | Ht 62.0 in | Wt 219.0 lb

## 2013-06-01 DIAGNOSIS — E669 Obesity, unspecified: Secondary | ICD-10-CM | POA: Insufficient documentation

## 2013-06-01 DIAGNOSIS — G43019 Migraine without aura, intractable, without status migrainosus: Secondary | ICD-10-CM

## 2013-06-01 DIAGNOSIS — Z3480 Encounter for supervision of other normal pregnancy, unspecified trimester: Secondary | ICD-10-CM

## 2013-06-01 MED ORDER — CONCEPT OB 130-92.4-1 MG PO CAPS
1.0000 | ORAL_CAPSULE | Freq: Every day | ORAL | Status: DC
Start: 2013-06-01 — End: 2013-06-23

## 2013-06-01 NOTE — Patient Instructions (Signed)
Migraine Headache A migraine headache is an intense, throbbing pain on one or both sides of your head. A migraine can last for 30 minutes to several hours. CAUSES  The exact cause of a migraine headache is not always known. However, a migraine may be caused when nerves in the brain become irritated and release chemicals that cause inflammation. This causes pain. Certain things may also trigger migraines, such as:  Alcohol.  Smoking.  Stress.  Menstruation.  Aged cheeses.  Foods or drinks that contain nitrates, glutamate, aspartame, or tyramine.  Lack of sleep.  Chocolate.  Caffeine.  Hunger.  Physical exertion.  Fatigue.  Medicines used to treat chest pain (nitroglycerine), birth control pills, estrogen, and some blood pressure medicines. SIGNS AND SYMPTOMS  Pain on one or both sides of your head.  Pulsating or throbbing pain.  Severe pain that prevents daily activities.  Pain that is aggravated by any physical activity.  Nausea, vomiting, or both.  Dizziness.  Pain with exposure to bright lights, loud noises, or activity.  General sensitivity to bright lights, loud noises, or smells. Before you get a migraine, you may get warning signs that a migraine is coming (aura). An aura may include:  Seeing flashing lights.  Seeing bright spots, halos, or zig-zag lines.  Having tunnel vision or blurred vision.  Having feelings of numbness or tingling.  Having trouble talking.  Having muscle weakness. DIAGNOSIS  A migraine headache is often diagnosed based on:  Symptoms.  Physical exam.  A CT scan or MRI of your head. These imaging tests cannot diagnose migraines, but they can help rule out other causes of headaches. TREATMENT Medicines may be given for pain and nausea. Medicines can also be given to help prevent recurrent migraines.  HOME CARE INSTRUCTIONS  Only take over-the-counter or prescription medicines for pain or discomfort as directed by your  health care provider. The use of long-term narcotics is not recommended.  Lie down in a dark, quiet room when you have a migraine.  Keep a journal to find out what may trigger your migraine headaches. For example, write down:  What you eat and drink.  How much sleep you get.  Any change to your diet or medicines.  Limit alcohol consumption.  Quit smoking if you smoke.  Get 7 9 hours of sleep, or as recommended by your health care provider.  Limit stress.  Keep lights dim if bright lights bother you and make your migraines worse. SEEK IMMEDIATE MEDICAL CARE IF:   Your migraine becomes severe.  You have a fever.  You have a stiff neck.  You have vision loss.  You have muscular weakness or loss of muscle control.  You start losing your balance or have trouble walking.  You feel faint or pass out.  You have severe symptoms that are different from your first symptoms. MAKE SURE YOU:   Understand these instructions.  Will watch your condition.  Will get help right away if you are not doing well or get worse. Document Released: 04/15/2005 Document Revised: 02/03/2013 Document Reviewed: 12/21/2012 ExitCare Patient Information 2014 [employer].  

## 2013-06-01 NOTE — Progress Notes (Signed)
History:  Pamela Howard is a 28 y.o. G2P1001 who presents to [place] [facility] today for follow up on her migraines in pregnancy. She is doing much better. The trigger point injections were very helpful in breaking the headache cycle and she did not even have a headache for almost 2 weeks after. Since then she has been using the Imitrex and Flexeril for migraine control. For some reason she did not get phenergan filled. She will go back to pharmacy and pick that medication up. She has moved into her new house and things are less stressful.   The following portions of the patient's history were reviewed and updated as appropriate: allergies, current medications, past family history, past medical history, past social history, past surgical history and problem list.  Review of Systems:  Pertinent items are noted in HPI.  Objective:  Physical Exam BP 119/77  Pulse 104  Resp 16  Ht 5\' 2"  (1.575 m)  Wt 219 lb (99.338 kg)  BMI 40.05 kg/m2  LMP 12/07/2012 GENERAL: Well-developed, well-nourished female in no acute distress.  HEENT: Normocephalic, atraumatic.  NECK: Supple. Normal thyroid.  LUNGS: Normal rate. Clear to auscultation bilaterally.  HEART: Regular rate and rhythm with no adventitious sounds.  EXTREMITIES: No cyanosis, clubbing, or edema, 2+ distal pulses.   Labs and Imaging No results found.  Assessment & Plan:  Assessment:  Migraine in pregnancy  Plans:  We reviewed all of her medications and how they are to be used She will pick up phenergan and add that as needed She is clearly doing very well and can return on prn basis  Delbert PhenixLinda M Sanye Ledesma, NP 06/01/2013 9:25 AM

## 2013-06-17 ENCOUNTER — Other Ambulatory Visit: Payer: Self-pay | Admitting: Nurse Practitioner

## 2013-06-21 ENCOUNTER — Other Ambulatory Visit: Payer: Self-pay | Admitting: *Deleted

## 2013-06-21 ENCOUNTER — Encounter: Payer: 59 | Admitting: Advanced Practice Midwife

## 2013-06-21 DIAGNOSIS — G43909 Migraine, unspecified, not intractable, without status migrainosus: Secondary | ICD-10-CM

## 2013-06-21 MED ORDER — CYCLOBENZAPRINE HCL 10 MG PO TABS: 10.0000 mg | ORAL_TABLET | Freq: Two times a day (BID) | ORAL | Status: DC | PRN

## 2013-06-21 NOTE — Telephone Encounter (Signed)
RF authorization sent to [facility] for Flexeril 10 mg per Jannifer RodneyLinda Barefoot, NP

## 2013-06-23 ENCOUNTER — Ambulatory Visit (INDEPENDENT_AMBULATORY_CARE_PROVIDER_SITE_OTHER): Payer: 59 | Admitting: Advanced Practice Midwife

## 2013-06-23 ENCOUNTER — Encounter: Payer: Self-pay | Admitting: Advanced Practice Midwife

## 2013-06-23 VITALS — BP 106/68 | Wt 222.0 lb

## 2013-06-23 DIAGNOSIS — Z3480 Encounter for supervision of other normal pregnancy, unspecified trimester: Secondary | ICD-10-CM

## 2013-06-23 DIAGNOSIS — G43909 Migraine, unspecified, not intractable, without status migrainosus: Secondary | ICD-10-CM

## 2013-06-23 MED ORDER — PRENATAL FORMULA 28-0.8-235 MG PO CAPS: 1.0000 | ORAL_CAPSULE | Freq: Every day | ORAL | Status: DC

## 2013-06-23 MED ORDER — CYCLOBENZAPRINE HCL 10 MG PO TABS: 5.0000 mg | ORAL_TABLET | Freq: Three times a day (TID) | ORAL | Status: DC | PRN

## 2013-06-23 NOTE — Progress Notes (Signed)
p-109

## 2013-06-23 NOTE — Progress Notes (Signed)
Doing well.  Good fetal movement, denies vaginal bleeding, LOF, regular contractions. Reports occasional back spasms.  Was on Flexeril for h/a, helps with migraines and back pain but needs refill.  Flexeril 5-10 mg TID PRN sent to pharmacy.  Pt reported her PNV was $18 because of brand name.  Switched PNV, instructed pt to call or have pharmacy call if Rx not covered by insurance.  Glucose screen done today.

## 2013-06-24 LAB — CBC
HCT: 37.1 % (ref 36.0–46.0)
HEMOGLOBIN: 12.5 g/dL (ref 12.0–15.0)
MCH: 30.6 pg (ref 26.0–34.0)
MCHC: 33.7 g/dL (ref 30.0–36.0)
MCV: 90.7 fL (ref 78.0–100.0)
PLATELETS: 296 10*3/uL (ref 150–400)
RBC: 4.09 MIL/uL (ref 3.87–5.11)
RDW: 14.2 % (ref 11.5–15.5)
WBC: 13.2 10*3/uL — ABNORMAL HIGH (ref 4.0–10.5)

## 2013-06-24 LAB — GLUCOSE TOLERANCE, 1 HOUR (50G) W/O FASTING: Glucose, 1 Hour GTT: 65 mg/dL — ABNORMAL LOW (ref 70–140)

## 2013-06-24 LAB — RPR

## 2013-06-25 LAB — HIV ANTIBODY (ROUTINE TESTING W REFLEX): HIV: NONREACTIVE

## 2013-06-28 ENCOUNTER — Telehealth: Payer: Self-pay | Admitting: *Deleted

## 2013-06-28 NOTE — Telephone Encounter (Signed)
Pt notified of 28 week labs.

## 2013-07-08 ENCOUNTER — Encounter: Payer: 59 | Admitting: Obstetrics and Gynecology

## 2013-07-20 ENCOUNTER — Ambulatory Visit (INDEPENDENT_AMBULATORY_CARE_PROVIDER_SITE_OTHER): Payer: 59 | Admitting: *Deleted

## 2013-07-20 VITALS — BP 123/81 | HR 96 | Resp 16 | Wt 230.0 lb

## 2013-07-20 DIAGNOSIS — IMO0002 Reserved for concepts with insufficient information to code with codable children: Secondary | ICD-10-CM

## 2013-07-20 DIAGNOSIS — Z3490 Encounter for supervision of normal pregnancy, unspecified, unspecified trimester: Secondary | ICD-10-CM

## 2013-07-20 NOTE — Progress Notes (Signed)
Pt here for blood pressure check.  Her chief complaint is that her feet and ankles were very swollen last night and hurt.  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  She denies any visual disturbances, dizziness or headaches.  I explained that it is common for her feet to swell during the day but that they should go down during the night as she has them elevated.  Pt is scheduled for a regular OB visit on Thursday.  She will call with any further problems.

## 2013-07-22 ENCOUNTER — Ambulatory Visit (INDEPENDENT_AMBULATORY_CARE_PROVIDER_SITE_OTHER): Payer: 59 | Admitting: Obstetrics & Gynecology

## 2013-07-22 ENCOUNTER — Encounter: Payer: Self-pay | Admitting: Obstetrics & Gynecology

## 2013-07-22 VITALS — BP 124/83 | Wt 225.0 lb

## 2013-07-22 DIAGNOSIS — Z3490 Encounter for supervision of normal pregnancy, unspecified, unspecified trimester: Secondary | ICD-10-CM

## 2013-07-22 DIAGNOSIS — Z3480 Encounter for supervision of other normal pregnancy, unspecified trimester: Secondary | ICD-10-CM

## 2013-07-22 DIAGNOSIS — Z23 Encounter for immunization: Secondary | ICD-10-CM

## 2013-07-22 NOTE — Progress Notes (Signed)
P - 114

## 2013-07-22 NOTE — Progress Notes (Signed)
Routine visit. Good FM. No problems. TDAP today.

## 2013-08-05 ENCOUNTER — Encounter: Payer: 59 | Admitting: Obstetrics & Gynecology

## 2013-08-13 ENCOUNTER — Ambulatory Visit (INDEPENDENT_AMBULATORY_CARE_PROVIDER_SITE_OTHER): Payer: 59 | Admitting: Advanced Practice Midwife

## 2013-08-13 VITALS — BP 132/88 | Wt 229.0 lb

## 2013-08-13 DIAGNOSIS — Z3480 Encounter for supervision of other normal pregnancy, unspecified trimester: Secondary | ICD-10-CM

## 2013-08-13 LAB — OB RESULTS CONSOLE GC/CHLAMYDIA
CHLAMYDIA, DNA PROBE: NEGATIVE
Gonorrhea: NEGATIVE

## 2013-08-13 LAB — OB RESULTS CONSOLE GBS: GBS: NEGATIVE

## 2013-08-13 NOTE — Progress Notes (Signed)
Doing well.  Good fetal movement, denies vaginal bleeding, LOF.  Feeling constant pressure, no regular contractions. GBS collected.

## 2013-08-14 LAB — GC/CHLAMYDIA PROBE AMP
CT PROBE, AMP APTIMA: NEGATIVE
GC PROBE AMP APTIMA: NEGATIVE

## 2013-08-15 LAB — CULTURE, BETA STREP (GROUP B ONLY)

## 2013-08-16 ENCOUNTER — Encounter: Payer: Self-pay | Admitting: *Deleted

## 2013-08-17 ENCOUNTER — Telehealth: Payer: Self-pay | Admitting: *Deleted

## 2013-08-17 ENCOUNTER — Other Ambulatory Visit: Payer: Self-pay | Admitting: Obstetrics & Gynecology

## 2013-08-17 DIAGNOSIS — Z3480 Encounter for supervision of other normal pregnancy, unspecified trimester: Secondary | ICD-10-CM

## 2013-08-17 MED ORDER — RANITIDINE HCL 150 MG PO TABS: 150.0000 mg | ORAL_TABLET | Freq: Two times a day (BID) | ORAL | Status: DC

## 2013-08-17 NOTE — Telephone Encounter (Signed)
Refilled zantac.

## 2013-08-23 ENCOUNTER — Ambulatory Visit (INDEPENDENT_AMBULATORY_CARE_PROVIDER_SITE_OTHER): Payer: 59 | Admitting: Advanced Practice Midwife

## 2013-08-23 ENCOUNTER — Encounter: Payer: Self-pay | Admitting: *Deleted

## 2013-08-23 VITALS — BP 139/90 | HR 115 | Wt 232.0 lb

## 2013-08-23 DIAGNOSIS — Z3480 Encounter for supervision of other normal pregnancy, unspecified trimester: Secondary | ICD-10-CM

## 2013-08-23 LAB — CBC
HCT: 38.8 % (ref 36.0–46.0)
Hemoglobin: 13.4 g/dL (ref 12.0–15.0)
MCH: 30.5 pg (ref 26.0–34.0)
MCHC: 34.5 g/dL (ref 30.0–36.0)
MCV: 88.4 fL (ref 78.0–100.0)
Platelets: 214 10*3/uL (ref 150–400)
RBC: 4.39 MIL/uL (ref 3.87–5.11)
RDW: 14.9 % (ref 11.5–15.5)
WBC: 8.1 10*3/uL (ref 4.0–10.5)

## 2013-08-23 NOTE — Progress Notes (Signed)
Rechecked BP 123/90

## 2013-08-23 NOTE — Progress Notes (Signed)
Doing well.  Good fetal movement, denies vaginal bleeding, LOF, regular contractions.  Denies h/a, epigastric pain, visual disturbances.  BLE x4 days, reduces but does not resolve completely with elevation. CBC, CMP, P/C ratio today.

## 2013-08-24 LAB — PROTEIN / CREATININE RATIO, URINE
Creatinine, Urine: 73.9 mg/dL
Protein Creatinine Ratio: 0.08 (ref <0.15)
TOTAL PROTEIN, URINE: 6 mg/dL

## 2013-08-24 NOTE — Addendum Note (Signed)
Addended by: Arne ClevelandHUTCHINSON, Cruise Baumgardner J on: 08/24/2013 03:57 PM   Modules accepted: Orders

## 2013-08-30 ENCOUNTER — Inpatient Hospital Stay (HOSPITAL_COMMUNITY)
Admission: RE | Admit: 2013-08-30 | Discharge: 2013-09-02 | DRG: 765 | Disposition: A | Discharge status: Home / Self Care | Payer: 59 | Source: Ambulatory Visit | Attending: Obstetrics & Gynecology | Admitting: Obstetrics & Gynecology

## 2013-08-30 ENCOUNTER — Ambulatory Visit (INDEPENDENT_AMBULATORY_CARE_PROVIDER_SITE_OTHER): Payer: 59 | Admitting: Advanced Practice Midwife

## 2013-08-30 ENCOUNTER — Encounter (HOSPITAL_COMMUNITY): Payer: Self-pay

## 2013-08-30 VITALS — BP 143/90 | HR 110 | Wt 236.0 lb

## 2013-08-30 VITALS — BP 115/81 | HR 97 | Temp 98.3°F | Resp 18 | Ht 62.0 in | Wt 236.0 lb

## 2013-08-30 DIAGNOSIS — B009 Herpesviral infection, unspecified: Secondary | ICD-10-CM | POA: Insufficient documentation

## 2013-08-30 DIAGNOSIS — O133 Gestational [pregnancy-induced] hypertension without significant proteinuria, third trimester: Secondary | ICD-10-CM

## 2013-08-30 DIAGNOSIS — O139 Gestational [pregnancy-induced] hypertension without significant proteinuria, unspecified trimester: Principal | ICD-10-CM | POA: Diagnosis present

## 2013-08-30 DIAGNOSIS — O99214 Obesity complicating childbirth: Secondary | ICD-10-CM

## 2013-08-30 DIAGNOSIS — O09899 Supervision of other high risk pregnancies, unspecified trimester: Secondary | ICD-10-CM

## 2013-08-30 DIAGNOSIS — Z8249 Family history of ischemic heart disease and other diseases of the circulatory system: Secondary | ICD-10-CM

## 2013-08-30 DIAGNOSIS — O411090 Infection of amniotic sac and membranes, unspecified, unspecified trimester, not applicable or unspecified: Secondary | ICD-10-CM | POA: Diagnosis present

## 2013-08-30 DIAGNOSIS — O98519 Other viral diseases complicating pregnancy, unspecified trimester: Secondary | ICD-10-CM | POA: Diagnosis present

## 2013-08-30 DIAGNOSIS — Z3490 Encounter for supervision of normal pregnancy, unspecified, unspecified trimester: Secondary | ICD-10-CM

## 2013-08-30 DIAGNOSIS — F1290 Cannabis use, unspecified, uncomplicated: Secondary | ICD-10-CM

## 2013-08-30 DIAGNOSIS — F411 Generalized anxiety disorder: Secondary | ICD-10-CM | POA: Diagnosis present

## 2013-08-30 DIAGNOSIS — A6000 Herpesviral infection of urogenital system, unspecified: Secondary | ICD-10-CM | POA: Diagnosis present

## 2013-08-30 DIAGNOSIS — Z6841 Body Mass Index (BMI) 40.0 and over, adult: Secondary | ICD-10-CM

## 2013-08-30 DIAGNOSIS — O99344 Other mental disorders complicating childbirth: Secondary | ICD-10-CM | POA: Diagnosis present

## 2013-08-30 DIAGNOSIS — E669 Obesity, unspecified: Secondary | ICD-10-CM | POA: Diagnosis present

## 2013-08-30 HISTORY — DX: Anxiety disorder, unspecified: F41.9

## 2013-08-30 HISTORY — DX: Gestational (pregnancy-induced) hypertension without significant proteinuria, unspecified trimester: O13.9

## 2013-08-30 HISTORY — DX: Herpesviral infection of urogenital system, unspecified: A60.00

## 2013-08-30 HISTORY — DX: Obesity, unspecified: E66.9

## 2013-08-30 HISTORY — DX: Herpesviral infection, unspecified: B00.9

## 2013-08-30 LAB — COMPREHENSIVE METABOLIC PANEL
ALBUMIN: 2.8 g/dL — AB (ref 3.5–5.2)
ALT: 12 U/L (ref 0–35)
AST: 23 U/L (ref 0–37)
Alkaline Phosphatase: 293 U/L — ABNORMAL HIGH (ref 39–117)
BUN: 6 mg/dL (ref 6–23)
CALCIUM: 9.2 mg/dL (ref 8.4–10.5)
CO2: 19 mEq/L (ref 19–32)
Chloride: 102 mEq/L (ref 96–112)
Creatinine, Ser: 0.58 mg/dL (ref 0.50–1.10)
GFR calc Af Amer: >90 mL/min (ref >90)
GFR calc non Af Amer: >90 mL/min (ref >90)
Glucose, Bld: 89 mg/dL (ref 70–99)
POTASSIUM: 4.1 meq/L (ref 3.7–5.3)
Sodium: 137 mEq/L (ref 137–147)
TOTAL PROTEIN: 5.9 g/dL — AB (ref 6.0–8.3)
Total Bilirubin: 0.5 mg/dL (ref 0.3–1.2)

## 2013-08-30 LAB — CBC
HCT: 39.5 % (ref 36.0–46.0)
Hemoglobin: 13 g/dL (ref 12.0–15.0)
MCH: 30.2 pg (ref 26.0–34.0)
MCHC: 32.9 g/dL (ref 30.0–36.0)
MCV: 91.9 fL (ref 78.0–100.0)
PLATELETS: 216 10*3/uL (ref 150–400)
RBC: 4.3 MIL/uL (ref 3.87–5.11)
RDW: 14.5 % (ref 11.5–15.5)
WBC: 10.1 10*3/uL (ref 4.0–10.5)

## 2013-08-30 LAB — ABO/RH: ABO/RH(D): O POS

## 2013-08-30 LAB — TYPE AND SCREEN
ABO/RH(D): O POS
ANTIBODY SCREEN: NEGATIVE

## 2013-08-30 LAB — PROTEIN / CREATININE RATIO, URINE
Creatinine, Urine: 80.49 mg/dL
PROTEIN CREATININE RATIO: 0.17 — AB (ref 0.00–0.15)
TOTAL PROTEIN, URINE: 13.8 mg/dL

## 2013-08-30 MED ORDER — PHENYLEPHRINE 40 MCG/ML (10ML) SYRINGE FOR IV PUSH (FOR BLOOD PRESSURE SUPPORT)
80.0000 ug | PREFILLED_SYRINGE | INTRAVENOUS | Status: DC | PRN
  Administered 2013-08-31: 40 ug via INTRAVENOUS

## 2013-08-30 MED ORDER — LACTATED RINGERS IV SOLN: 500.0000 mL | INTRAVENOUS | Status: DC | PRN

## 2013-08-30 MED ORDER — DIPHENHYDRAMINE HCL 50 MG/ML IJ SOLN
12.5000 mg | INTRAMUSCULAR | Status: DC | PRN
  Administered 2013-08-31 (×2): 12.5 mg via INTRAVENOUS
  Filled 2013-08-30: qty 1

## 2013-08-30 MED ORDER — PHENYLEPHRINE 40 MCG/ML (10ML) SYRINGE FOR IV PUSH (FOR BLOOD PRESSURE SUPPORT)
80.0000 ug | PREFILLED_SYRINGE | INTRAVENOUS | Status: DC | PRN
  Filled 2013-08-30 (×2): qty 10

## 2013-08-30 MED ORDER — TERBUTALINE SULFATE 1 MG/ML IJ SOLN: 0.2500 mg | Freq: Once | INTRAMUSCULAR | Status: AC | PRN

## 2013-08-30 MED ORDER — OXYTOCIN BOLUS FROM INFUSION: 500.0000 mL | INTRAVENOUS | Status: DC

## 2013-08-30 MED ORDER — OXYTOCIN 40 UNITS IN LACTATED RINGERS INFUSION - SIMPLE MED
62.5000 mL/h | INTRAVENOUS | Status: DC
  Filled 2013-08-30: qty 1000

## 2013-08-30 MED ORDER — EPHEDRINE 5 MG/ML INJ: 10.0000 mg | INTRAVENOUS | Status: DC | PRN

## 2013-08-30 MED ORDER — ONDANSETRON HCL 4 MG/2ML IJ SOLN: 4.0000 mg | Freq: Four times a day (QID) | INTRAMUSCULAR | Status: DC | PRN

## 2013-08-30 MED ORDER — EPHEDRINE 5 MG/ML INJ
10.0000 mg | INTRAVENOUS | Status: DC | PRN
  Filled 2013-08-30: qty 4

## 2013-08-30 MED ORDER — ACETAMINOPHEN 325 MG PO TABS: 650.0000 mg | ORAL_TABLET | ORAL | Status: DC | PRN

## 2013-08-30 MED ORDER — OXYCODONE-ACETAMINOPHEN 5-325 MG PO TABS: 1.0000 | ORAL_TABLET | ORAL | Status: DC | PRN

## 2013-08-30 MED ORDER — FENTANYL 2.5 MCG/ML BUPIVACAINE 1/10 % EPIDURAL INFUSION (WH - ANES)
14.0000 mL/h | INTRAMUSCULAR | Status: DC | PRN
  Filled 2013-08-30: qty 125

## 2013-08-30 MED ORDER — LIDOCAINE HCL (PF) 1 % IJ SOLN: 30.0000 mL | INTRAMUSCULAR | Status: DC | PRN

## 2013-08-30 MED ORDER — IBUPROFEN 600 MG PO TABS: 600.0000 mg | ORAL_TABLET | Freq: Four times a day (QID) | ORAL | Status: DC | PRN

## 2013-08-30 MED ORDER — LACTATED RINGERS IV SOLN
INTRAVENOUS | Status: DC
Start: 2013-08-30 — End: 2013-08-31
  Administered 2013-08-30 – 2013-08-31 (×3): via INTRAVENOUS

## 2013-08-30 MED ORDER — CITRIC ACID-SODIUM CITRATE 334-500 MG/5ML PO SOLN
30.0000 mL | ORAL | Status: DC | PRN
  Administered 2013-08-31 (×3): 30 mL via ORAL
  Filled 2013-08-30 (×3): qty 15

## 2013-08-30 MED ORDER — LACTATED RINGERS IV SOLN: 500.0000 mL | Freq: Once | INTRAVENOUS | Status: DC

## 2013-08-30 MED ORDER — FENTANYL CITRATE 0.05 MG/ML IJ SOLN
100.0000 ug | INTRAMUSCULAR | Status: DC | PRN
  Administered 2013-08-30: 100 ug via INTRAVENOUS
  Filled 2013-08-30: qty 2

## 2013-08-30 NOTE — Progress Notes (Signed)
Pt states edema in LE has gotten worse this past week with pitting over weekend.

## 2013-08-30 NOTE — Progress Notes (Signed)
Pamela Howard is a 28 y.o. G2P1001 at 7487w3d by ultrasound admitted for induction of labor [facility]  Subjective: Pt is feeling some contractions now but is comfortable.  Foley bulb just out and SROM. No headache, scotoma or RUQ pain. Pt defers cervical check now.  Would like to get a shower.   Objective: BP 145/90  Pulse 86  Temp(Src) 98.1 F (36.7 C) (Oral)  Resp 20  Ht 5\' 2"  (1.575 m)  Wt 107.049 kg (236 lb)  BMI 43.15 kg/m2  LMP 12/07/2012      FHT:  Baseline 140s, + accels, moderate variability, no decels, Category I tracing UC:   Irregualr SVE:   Dilation: 1.5 Effacement (%): 30 Station: -3 Exam by:: Dr. Ike Benedom  Labs: Lab Results  Component Value Date   WBC 10.1 08/30/2013   HGB 13.0 08/30/2013   HCT 39.5 08/30/2013   MCV 91.9 08/30/2013   PLT 216 08/30/2013   CMP     Component Value Date/Time   NA 137 08/30/2013 2000   K 4.1 08/30/2013 2000   CL 102 08/30/2013 2000   CO2 19 08/30/2013 2000   GLUCOSE 89 08/30/2013 2000   BUN 6 08/30/2013 2000   CREATININE 0.58 08/30/2013 2000   CALCIUM 9.2 08/30/2013 2000   PROT 5.9* 08/30/2013 2000   ALBUMIN 2.8* 08/30/2013 2000   AST 23 08/30/2013 2000   ALT 12 08/30/2013 2000   ALKPHOS 293* 08/30/2013 2000   BILITOT 0.5 08/30/2013 2000   GFRNONAA >90 08/30/2013 2000   GFRAA >90 08/30/2013 2000    Pr: Cr 0.17 Assessment / Plan: Pamela Howard is a 28 y.o. G2P1001 at 9087w3d by L=8 here for IOL for GHTN  Labor: Foley bulb is now out.  Irregular contractions.   Preeclampsia:  BP stable, asymptomatic. Pr/Cr, platelets and LFTS wnl. Fetal Wellbeing:  Category I Pain Control:  Desires IV pain meds and then epidural I/D:  GBS negative Anticipated MOD:  NSVD  Robyn H Restrepo 08/30/2013, 11:59 PM  I spoke with and examined patient and agree with resident's note and plan of care.  Tawana ScaleMichael Ryan Adian Jablonowski, MD OB Fellow 08/31/2013 12:50 AM

## 2013-08-30 NOTE — H&P (Signed)
Pamela Howard is a 28 y.o. female G2P1001 with IUP at 9951w3d presenting for IOL GHTN - hx of scotoma and headaches but all resolved at this time. Pt states she has been having none contractions, associated with none vaginal bleeding.  Membranes are intact, with active fetal movement.   [facility]  Prenatal History/Complications: HSV on valtrex GHTN neg labs  No Headacehs, vision changes, No RUQ pain, moderate swelling. No n/v, no d/c, +GERD, no urinary symptoms, no bowel symptoms. No other complaints  Past Medical History: Past Medical History  Diagnosis Date  . Headache     migraines  . Herpes     Past Surgical History: Past Surgical History  Procedure Laterality Date  . Wisdom tooth extraction      Obstetrical History: OB History   Grav Para Term Preterm Abortions TAB SAB Ect Mult Living   2 1 1       1       Gynecological History: OB History   Grav Para Term Preterm Abortions TAB SAB Ect Mult Living   2 1 1       1       Social History: History   Social History  . Marital Status: Single    Spouse Name: N/A    Number of Children: N/A  . Years of Education: N/A   Social History Main Topics  . Smoking status: Never Smoker   . Smokeless tobacco: None  . Alcohol Use: No  . Drug Use: No  . Sexual Activity: Yes    Partners: Male   Other Topics Concern  . None   Social History Narrative  . None    Family History: Family History  Problem Relation Age of Onset  . Heart failure Father   . Hypertension Father   . Migraines Sister   . Migraines Paternal Grandmother     Allergies: No Known Allergies  Prescriptions prior to admission  Medication Sig Dispense Refill  . acetaminophen (TYLENOL) 500 MG tablet Take 1,000 mg by mouth every 6 (six) hours as needed for mild pain.      . cyclobenzaprine (FLEXERIL) 10 MG tablet Take 0.5-1 tablets (5-10 mg total) by mouth 3 (three) times daily as needed for muscle spasms.  60 tablet  0  . famotidine  (PEPCID) 20 MG tablet Take 20 mg by mouth.      Marland Kitchen. ibuprofen (ADVIL,MOTRIN) 600 MG tablet Take 600 mg by mouth daily as needed.      . Prenatal Vit-Fe Fum-FA-Omega (PRENATAL FORMULA) 28-0.8-235 MG CAPS Take 1 capsule by mouth daily.  30 capsule  10  . promethazine (PHENERGAN) 25 MG tablet Take 1 tablet (25 mg total) by mouth every 6 (six) hours as needed for nausea or vomiting.  30 tablet  1  . ranitidine (ZANTAC) 150 MG tablet Take 1 tablet (150 mg total) by mouth 2 (two) times daily.  60 tablet  1  . ranitidine (ZANTAC) 150 MG tablet TAKE 1 TABLET BY MOUTH TWICE DAILY  60 tablet  1  . SUMAtriptan (IMITREX) 100 MG tablet Take 1 tablet (100 mg total) by mouth every 2 (two) hours as needed for migraine or headache. May repeat in 2 hours if headache persists or recurs.  9 tablet  11  . valACYclovir (VALTREX) 500 MG tablet Take 1 tablet (500 mg total) by mouth daily. Can increase to twice a day for 5 days in the event of a recurrence  30 tablet  12  Review of Systems   Constitutional: see above  Blood pressure 137/95, pulse 98, temperature 98.1 F (36.7 C), temperature source Oral, resp. rate 18, height 5\' 2"  (1.575 m), weight 107.049 kg (236 lb), last menstrual period 12/07/2012. General appearance: alert, cooperative and appears stated age Lungs: clear to auscultation bilaterally Heart: regular rate and rhythm Abdomen: soft, non-tender; bowel sounds normal Pelvic: adequate 1.5/40/-3 Extremities: Homans sign is negative, no sign of DVT DTR's 2+ Presentation: cephalic Fetal monitoringBaseline: 150s bpm, Variability: Good {> 6 bpm), Accelerations: Reactive and Decelerations: Absent Uterine activityNone    Office  [facility]  Genetic Screen  First trimester screen neg. CF neg; AFP negative  Anatomic US  nml  Glucose Screen  65  GC / Chlamydia  negative/negative   GBS   Neg  Feeding Preference  breast   Contraception  mirena  Peds   Circumcision  In house      Prenatal labs: ABO, Rh:  O/POS/-- (10/20 29560951) Antibody: NEG (10/20 0951) Rubella:  immune RPR: NON REAC (02/25 1600)  HBsAg: NEGATIVE (10/20 0951)  HIV: NON REACTIVE (02/25 1600)  GBS: Negative (04/17 0000)  1 hr Glucola 65 Genetic screening  WNL Anatomy US WNL   Prenatal Transfer Tool  Maternal Diabetes: No Genetic Screening: Normal Maternal Ultrasounds/Referrals: Normal Fetal Ultrasounds or other Referrals:  None Maternal Substance Abuse:  Yes:  Type: Marijuana Significant Maternal Medications:  Meds include: Other: Valtrex Significant Maternal Lab Results: Lab values include: Group B Strep negative  No results found for this or any previous visit (from the past 24 hour(s)).  Assessment: Pamela Howard is a 28 y.o. G2P1001 at 438w3d by L=8 here for IOL for GHTN #Labor: Will start FB, augment if not out by 12 hrs.  #Pain: Iv pain meds and then epidural #FWB: Cat I #ID:  GBS Neg #MOF: Breast #MOC:Mirean #Circ:  In house #GHTN: repeat labs now. Mild range BPs, no mag at this time.  Minta BalsamMichael R Ameena Vesey 08/30/2013, 8:47 PM

## 2013-08-30 NOTE — Progress Notes (Signed)
BP up again. Labs normal last week, but no CMET. Scotoma x 2 this week. Migraine x 1 (not new--doubt related to gest HTN). Per consult w/ Dr. Macon LargeAnyanwu will get NST now (reactive) and IOL tonight at 7:30 pm. Vtx per US in office today.

## 2013-08-30 NOTE — Patient Instructions (Signed)
Hypertension During Pregnancy Hypertension is also called high blood pressure. It can occur at any time in life and during pregnancy. When you have hypertension, there is extra pressure inside your blood vessels that carry blood from the heart to the rest of your body (arteries). Hypertension during pregnancy can cause problems for you and your baby. Your baby might not weigh as much as it should at birth or might be born early (premature). Very bad cases of hypertension during pregnancy can be life threatening.  Different types of hypertension can occur during pregnancy.   Chronic hypertension. This happens when a woman has hypertension before pregnancy and it continues during pregnancy.  Gestational hypertension. This is when hypertension develops during pregnancy.  Preeclampsia or toxemia of pregnancy. This is a very serious type of hypertension that develops only during pregnancy. It is a disease that affects the whole body (systemic) and can be very dangerous for both mother and baby.  Gestational hypertension and preeclampsia usually go away after your baby is born. Blood pressure generally stabilizes within 6 weeks. Women who have hypertension during pregnancy have a greater chance of developing hypertension later in life or with future pregnancies. RISK FACTORS Some factors make you more likely to develop hypertension during pregnancy. Risk factors include:  Having hypertension before pregnancy.  Having hypertension during a previous pregnancy.  Being overweight.  Being older than 40.  Being pregnant with more than one baby (multiples).  Having diabetes or kidney problems. SIGNS AND SYMPTOMS Chronic and gestational hypertension rarely cause symptoms. Preeclampsia has symptoms, which may include:  Increased protein in your urine. Your health care provider will check for this at every prenatal visit.  Swelling of your hands and face.  Rapid weight gain.  Headaches.  Visual  changes.  Being bothered by light.  Abdominal pain, especially in the right upper area.  Chest pain.  Shortness of breath.  Increased reflexes.  Seizures. Seizures occur with a more severe form of preeclampsia, called eclampsia. DIAGNOSIS   You may be diagnosed with hypertension during a regular prenatal exam. At each visit, tests may include:  Blood pressure checks.  A urine test to check for protein in your urine.  The type of hypertension you are diagnosed with depends on when you developed it. It also depends on your specific blood pressure reading.  Developing hypertension before 20 weeks of pregnancy is consistent with chronic hypertension.  Developing hypertension after 20 weeks of pregnancy is consistent with gestational hypertension.  Hypertension with increased urinary protein is diagnosed as preeclampsia.  Blood pressure measurements that stay above 160 systolic or 110 diastolic are a sign of severe preeclampsia. TREATMENT Treatment for hypertension during pregnancy varies. Treatment depends on the type of hypertension and how serious it is.  If you take medicine for chronic hypertension, you may need to switch medicines.  Drugs called ACE inhibitors should not be taken during pregnancy.  Low-dose aspirin may be suggested for women who have risk factors for preeclampsia.  If you have gestational hypertension, you may need to take a blood pressure medicine that is safe during pregnancy. Your health care provider will recommend the appropriate medicine.  If you have severe preeclampsia, you may need to be in the hospital. Health care providers will watch you and the baby very closely. You also may need to take medicine (magnesium sulfate) to prevent seizures and lower blood pressure.  Sometimes an early delivery is needed. This may be the case if the condition worsens. It would   be done to protect you and the baby. The only cure for preeclampsia is delivery. HOME  CARE INSTRUCTIONS  Schedule and keep all of your regular appointments for prenatal care.  Only take over-the-counter or prescription medicines as directed by your health care provider. Tell your health care provider about all medicines you take.  Eat as little salt as possible.  Get regular exercise.  Do not drink alcohol.  Do not use tobacco products.  Do not drink products with caffeine.  Lie on your left side when resting. SEEK IMMEDIATE MEDICAL CARE IF:  You have severe abdominal pain.  You have sudden swelling in the hands, ankles, or face.  You gain 4 pounds (1.8 kg) or more in 1 week.  You vomit repeatedly.  You have vaginal bleeding.  You do not feel the baby moving as much.  You have a headache.  You have blurred or double vision.  You have muscle twitching or spasms.  You have shortness of breath.  You have blue fingernails and lips.  You have blood in your urine. MAKE SURE YOU:  Understand these instructions.  Will watch your condition.  Will get help right away if you are not doing well or get worse. Document Released: 01/01/2011 Document Revised: 02/03/2013 Document Reviewed: 11/12/2012 [employer] Patient Information 2014 [employer].  

## 2013-08-31 ENCOUNTER — Encounter (HOSPITAL_COMMUNITY)
Admission: RE | Disposition: A | Discharge status: Home / Self Care | Payer: Self-pay | Source: Ambulatory Visit | Attending: Obstetrics & Gynecology

## 2013-08-31 ENCOUNTER — Encounter (HOSPITAL_COMMUNITY): Payer: Self-pay

## 2013-08-31 ENCOUNTER — Inpatient Hospital Stay (HOSPITAL_COMMUNITY): Payer: 59 | Admitting: Anesthesiology

## 2013-08-31 ENCOUNTER — Encounter (HOSPITAL_COMMUNITY): Payer: 59 | Admitting: Anesthesiology

## 2013-08-31 DIAGNOSIS — Z6841 Body Mass Index (BMI) 40.0 and over, adult: Secondary | ICD-10-CM

## 2013-08-31 DIAGNOSIS — O139 Gestational [pregnancy-induced] hypertension without significant proteinuria, unspecified trimester: Secondary | ICD-10-CM

## 2013-08-31 DIAGNOSIS — O411090 Infection of amniotic sac and membranes, unspecified, unspecified trimester, not applicable or unspecified: Secondary | ICD-10-CM

## 2013-08-31 LAB — CBC
HEMATOCRIT: 37.8 % (ref 36.0–46.0)
Hemoglobin: 12.8 g/dL (ref 12.0–15.0)
MCH: 31.2 pg (ref 26.0–34.0)
MCHC: 33.9 g/dL (ref 30.0–36.0)
MCV: 92.2 fL (ref 78.0–100.0)
Platelets: 177 10*3/uL (ref 150–400)
RBC: 4.1 MIL/uL (ref 3.87–5.11)
RDW: 14.5 % (ref 11.5–15.5)
WBC: 11.4 10*3/uL — AB (ref 4.0–10.5)

## 2013-08-31 LAB — RPR

## 2013-08-31 SURGERY — Surgical Case
Anesthesia: Epidural | Site: Abdomen

## 2013-08-31 MED ORDER — ONDANSETRON HCL 4 MG PO TABS: 4.0000 mg | ORAL_TABLET | ORAL | Status: DC | PRN

## 2013-08-31 MED ORDER — BUPIVACAINE HCL (PF) 0.5 % IJ SOLN
INTRAMUSCULAR | Status: DC | PRN
  Administered 2013-08-31: 30 mL

## 2013-08-31 MED ORDER — MENTHOL 3 MG MT LOZG: 1.0000 | LOZENGE | OROMUCOSAL | Status: DC | PRN

## 2013-08-31 MED ORDER — TERBUTALINE SULFATE 1 MG/ML IJ SOLN: 0.2500 mg | Freq: Once | INTRAMUSCULAR | Status: DC | PRN

## 2013-08-31 MED ORDER — ONDANSETRON HCL 4 MG/2ML IJ SOLN
4.0000 mg | Freq: Three times a day (TID) | INTRAMUSCULAR | Status: DC | PRN
Start: 2013-08-31 — End: 2013-09-02

## 2013-08-31 MED ORDER — MEPERIDINE HCL 25 MG/ML IJ SOLN
INTRAMUSCULAR | Status: AC
  Filled 2013-08-31: qty 1

## 2013-08-31 MED ORDER — IBUPROFEN 600 MG PO TABS
600.0000 mg | ORAL_TABLET | Freq: Four times a day (QID) | ORAL | Status: DC
  Administered 2013-09-01 – 2013-09-02 (×7): 600 mg via ORAL
  Filled 2013-08-31 (×7): qty 1

## 2013-08-31 MED ORDER — ONDANSETRON HCL 4 MG/2ML IJ SOLN
INTRAMUSCULAR | Status: DC | PRN
  Administered 2013-08-31: 4 mg via INTRAVENOUS

## 2013-08-31 MED ORDER — 0.9 % SODIUM CHLORIDE (POUR BTL) OPTIME
TOPICAL | Status: DC | PRN
  Administered 2013-08-31: 1000 mL

## 2013-08-31 MED ORDER — DIPHENHYDRAMINE HCL 50 MG/ML IJ SOLN: 12.5000 mg | INTRAMUSCULAR | Status: DC | PRN

## 2013-08-31 MED ORDER — METOCLOPRAMIDE HCL 5 MG/ML IJ SOLN
INTRAMUSCULAR | Status: DC | PRN
  Administered 2013-08-31: 10 mg via INTRAVENOUS

## 2013-08-31 MED ORDER — FENTANYL 2.5 MCG/ML BUPIVACAINE 1/10 % EPIDURAL INFUSION (WH - ANES)
INTRAMUSCULAR | Status: DC | PRN
  Administered 2013-08-31: 12.5 mL/h via EPIDURAL

## 2013-08-31 MED ORDER — FENTANYL CITRATE 0.05 MG/ML IJ SOLN: 25.0000 ug | INTRAMUSCULAR | Status: DC | PRN

## 2013-08-31 MED ORDER — SODIUM CHLORIDE 0.9 % IV SOLN
2.0000 g | Freq: Four times a day (QID) | INTRAVENOUS | Status: DC
  Filled 2013-08-31 (×3): qty 2000

## 2013-08-31 MED ORDER — METOCLOPRAMIDE HCL 5 MG/ML IJ SOLN
INTRAMUSCULAR | Status: AC
  Filled 2013-08-31: qty 2

## 2013-08-31 MED ORDER — ZOLPIDEM TARTRATE 5 MG PO TABS: 5.0000 mg | ORAL_TABLET | Freq: Every evening | ORAL | Status: DC | PRN

## 2013-08-31 MED ORDER — OXYTOCIN 10 UNIT/ML IJ SOLN
INTRAMUSCULAR | Status: AC
  Filled 2013-08-31: qty 4

## 2013-08-31 MED ORDER — OXYTOCIN 40 UNITS IN LACTATED RINGERS INFUSION - SIMPLE MED
1.0000 m[IU]/min | INTRAVENOUS | Status: DC
  Administered 2013-08-31: 10 m[IU]/min via INTRAVENOUS

## 2013-08-31 MED ORDER — KETOROLAC TROMETHAMINE 30 MG/ML IJ SOLN
INTRAMUSCULAR | Status: AC
  Administered 2013-08-31: 30 mg via INTRAVENOUS
  Filled 2013-08-31: qty 1

## 2013-08-31 MED ORDER — LANOLIN HYDROUS EX OINT: 1.0000 "application " | TOPICAL_OINTMENT | CUTANEOUS | Status: DC | PRN

## 2013-08-31 MED ORDER — ONDANSETRON HCL 4 MG/2ML IJ SOLN: 4.0000 mg | INTRAMUSCULAR | Status: DC | PRN

## 2013-08-31 MED ORDER — OXYTOCIN 40 UNITS IN LACTATED RINGERS INFUSION - SIMPLE MED: 62.5000 mL/h | INTRAVENOUS | Status: AC

## 2013-08-31 MED ORDER — DIPHENHYDRAMINE HCL 25 MG PO CAPS
25.0000 mg | ORAL_CAPSULE | ORAL | Status: DC | PRN
  Administered 2013-08-31 – 2013-09-01 (×4): 25 mg via ORAL
  Filled 2013-08-31 (×4): qty 1

## 2013-08-31 MED ORDER — BUPIVACAINE HCL (PF) 0.5 % IJ SOLN
INTRAMUSCULAR | Status: AC
  Filled 2013-08-31: qty 30

## 2013-08-31 MED ORDER — PRENATAL MULTIVITAMIN CH
1.0000 | ORAL_TABLET | Freq: Every day | ORAL | Status: DC
  Administered 2013-09-01 – 2013-09-02 (×2): 1 via ORAL
  Filled 2013-08-31 (×2): qty 1

## 2013-08-31 MED ORDER — LIDOCAINE HCL (PF) 1 % IJ SOLN
INTRAMUSCULAR | Status: DC | PRN
  Administered 2013-08-31 (×2): 4 mL

## 2013-08-31 MED ORDER — MORPHINE SULFATE 0.5 MG/ML IJ SOLN
INTRAMUSCULAR | Status: AC
  Filled 2013-08-31: qty 10

## 2013-08-31 MED ORDER — MEASLES, MUMPS & RUBELLA VAC ~~LOC~~ INJ
0.5000 mL | INJECTION | Freq: Once | SUBCUTANEOUS | Status: DC
Start: 2013-09-01 — End: 2013-09-02

## 2013-08-31 MED ORDER — SODIUM CHLORIDE 0.9 % IJ SOLN: 3.0000 mL | INTRAMUSCULAR | Status: DC | PRN

## 2013-08-31 MED ORDER — PHENYLEPHRINE 8 MG IN D5W 100 ML (0.08MG/ML) PREMIX OPTIME
INJECTION | INTRAVENOUS | Status: AC
  Filled 2013-08-31: qty 100

## 2013-08-31 MED ORDER — SIMETHICONE 80 MG PO CHEW
80.0000 mg | CHEWABLE_TABLET | Freq: Three times a day (TID) | ORAL | Status: DC
  Administered 2013-09-01 – 2013-09-02 (×5): 80 mg via ORAL
  Filled 2013-08-31 (×5): qty 1

## 2013-08-31 MED ORDER — SCOPOLAMINE 1 MG/3DAYS TD PT72
1.0000 | MEDICATED_PATCH | Freq: Once | TRANSDERMAL | Status: DC
  Administered 2013-08-31: 1.5 mg via TRANSDERMAL

## 2013-08-31 MED ORDER — DIBUCAINE 1 % RE OINT: 1.0000 "application " | TOPICAL_OINTMENT | RECTAL | Status: DC | PRN

## 2013-08-31 MED ORDER — KETOROLAC TROMETHAMINE 30 MG/ML IJ SOLN: 30.0000 mg | Freq: Four times a day (QID) | INTRAMUSCULAR | Status: DC | PRN

## 2013-08-31 MED ORDER — WITCH HAZEL-GLYCERIN EX PADS: 1.0000 "application " | MEDICATED_PAD | CUTANEOUS | Status: DC | PRN

## 2013-08-31 MED ORDER — LACTATED RINGERS IV SOLN
INTRAVENOUS | Status: DC
  Administered 2013-09-01: 1 mL via INTRAVENOUS

## 2013-08-31 MED ORDER — SODIUM BICARBONATE 8.4 % IV SOLN
INTRAVENOUS | Status: DC | PRN
  Administered 2013-08-31: 10 mL via EPIDURAL
  Administered 2013-08-31: 5 mL via EPIDURAL

## 2013-08-31 MED ORDER — MORPHINE SULFATE (PF) 0.5 MG/ML IJ SOLN
INTRAMUSCULAR | Status: DC | PRN
  Administered 2013-08-31: 1 mg via INTRAVENOUS

## 2013-08-31 MED ORDER — MORPHINE SULFATE (PF) 0.5 MG/ML IJ SOLN
INTRAMUSCULAR | Status: DC | PRN
  Administered 2013-08-31: 4 mg via EPIDURAL

## 2013-08-31 MED ORDER — OXYTOCIN 40 UNITS IN LACTATED RINGERS INFUSION - SIMPLE MED
1.0000 m[IU]/min | INTRAVENOUS | Status: DC
  Administered 2013-08-31: 2 m[IU]/min via INTRAVENOUS
  Filled 2013-08-31: qty 1000

## 2013-08-31 MED ORDER — LACTATED RINGERS IV SOLN
INTRAVENOUS | Status: DC | PRN
  Administered 2013-08-31: 18:00:00 via INTRAVENOUS

## 2013-08-31 MED ORDER — FENTANYL 2.5 MCG/ML BUPIVACAINE 1/10 % EPIDURAL INFUSION (WH - ANES): 12.5000 mL/h | INTRAMUSCULAR | Status: DC | PRN

## 2013-08-31 MED ORDER — SENNOSIDES-DOCUSATE SODIUM 8.6-50 MG PO TABS
2.0000 | ORAL_TABLET | ORAL | Status: DC
  Administered 2013-08-31 – 2013-09-01 (×2): 2 via ORAL
  Filled 2013-08-31 (×2): qty 2

## 2013-08-31 MED ORDER — NALOXONE HCL 1 MG/ML IJ SOLN: 1.0000 ug/kg/h | INTRAVENOUS | Status: DC | PRN

## 2013-08-31 MED ORDER — MEPERIDINE HCL 25 MG/ML IJ SOLN
INTRAMUSCULAR | Status: DC | PRN
  Administered 2013-08-31 (×2): 12.5 mg via INTRAVENOUS

## 2013-08-31 MED ORDER — SIMETHICONE 80 MG PO CHEW
80.0000 mg | CHEWABLE_TABLET | ORAL | Status: DC
  Administered 2013-08-31 – 2013-09-01 (×2): 80 mg via ORAL
  Filled 2013-08-31 (×2): qty 1

## 2013-08-31 MED ORDER — KETOROLAC TROMETHAMINE 30 MG/ML IJ SOLN
30.0000 mg | Freq: Four times a day (QID) | INTRAMUSCULAR | Status: DC | PRN
  Administered 2013-08-31: 30 mg via INTRAVENOUS

## 2013-08-31 MED ORDER — PHENYLEPHRINE HCL 10 MG/ML IJ SOLN
INTRAMUSCULAR | Status: DC | PRN
  Administered 2013-08-31: 120 ug via INTRAVENOUS
  Administered 2013-08-31: 80 ug via INTRAVENOUS

## 2013-08-31 MED ORDER — NALOXONE HCL 0.4 MG/ML IJ SOLN: 0.4000 mg | INTRAMUSCULAR | Status: DC | PRN

## 2013-08-31 MED ORDER — SCOPOLAMINE 1 MG/3DAYS TD PT72
MEDICATED_PATCH | TRANSDERMAL | Status: AC
  Administered 2013-08-31: 1.5 mg via TRANSDERMAL
  Filled 2013-08-31: qty 1

## 2013-08-31 MED ORDER — NALBUPHINE HCL 10 MG/ML IJ SOLN
5.0000 mg | INTRAMUSCULAR | Status: DC | PRN
  Administered 2013-09-01: 10 mg via INTRAVENOUS
  Filled 2013-08-31: qty 1

## 2013-08-31 MED ORDER — DIPHENHYDRAMINE HCL 50 MG/ML IJ SOLN: 25.0000 mg | INTRAMUSCULAR | Status: DC | PRN

## 2013-08-31 MED ORDER — OXYCODONE-ACETAMINOPHEN 5-325 MG PO TABS
1.0000 | ORAL_TABLET | ORAL | Status: DC | PRN
  Administered 2013-09-01 – 2013-09-02 (×4): 1 via ORAL
  Filled 2013-08-31 (×4): qty 1

## 2013-08-31 MED ORDER — NALBUPHINE HCL 10 MG/ML IJ SOLN: 5.0000 mg | INTRAMUSCULAR | Status: DC | PRN

## 2013-08-31 MED ORDER — GENTAMICIN SULFATE 40 MG/ML IJ SOLN
170.0000 mg | Freq: Three times a day (TID) | INTRAVENOUS | Status: DC
  Filled 2013-08-31 (×2): qty 4.25

## 2013-08-31 MED ORDER — SIMETHICONE 80 MG PO CHEW: 80.0000 mg | CHEWABLE_TABLET | ORAL | Status: DC | PRN

## 2013-08-31 MED ORDER — MEPERIDINE HCL 25 MG/ML IJ SOLN: 6.2500 mg | INTRAMUSCULAR | Status: DC | PRN

## 2013-08-31 MED ORDER — LACTATED RINGERS IV SOLN
40.0000 [IU] | INTRAVENOUS | Status: DC | PRN
  Administered 2013-08-31: 40 [IU] via INTRAVENOUS

## 2013-08-31 MED ORDER — ONDANSETRON HCL 4 MG/2ML IJ SOLN
INTRAMUSCULAR | Status: AC
  Filled 2013-08-31: qty 2

## 2013-08-31 MED ORDER — CEFAZOLIN SODIUM-DEXTROSE 2-3 GM-% IV SOLR
INTRAVENOUS | Status: DC | PRN
Start: 2013-08-31 — End: 2013-08-31
  Administered 2013-08-31: 2 g via INTRAVENOUS

## 2013-08-31 MED ORDER — PHENYLEPHRINE 8 MG IN D5W 100 ML (0.08MG/ML) PREMIX OPTIME
INJECTION | INTRAVENOUS | Status: DC | PRN
  Administered 2013-08-31: 30 ug/min via INTRAVENOUS

## 2013-08-31 MED ORDER — PHENYLEPHRINE 40 MCG/ML (10ML) SYRINGE FOR IV PUSH (FOR BLOOD PRESSURE SUPPORT)
PREFILLED_SYRINGE | INTRAVENOUS | Status: AC
  Filled 2013-08-31: qty 5

## 2013-08-31 MED ORDER — TETANUS-DIPHTH-ACELL PERTUSSIS 5-2.5-18.5 LF-MCG/0.5 IM SUSP: 0.5000 mL | Freq: Once | INTRAMUSCULAR | Status: DC

## 2013-08-31 MED ORDER — METOCLOPRAMIDE HCL 5 MG/ML IJ SOLN: 10.0000 mg | Freq: Three times a day (TID) | INTRAMUSCULAR | Status: DC | PRN

## 2013-08-31 MED ORDER — DIPHENHYDRAMINE HCL 25 MG PO CAPS: 25.0000 mg | ORAL_CAPSULE | Freq: Four times a day (QID) | ORAL | Status: DC | PRN

## 2013-08-31 SURGICAL SUPPLY — 40 items
BENZOIN TINCTURE PRP APPL 2/3 (GAUZE/BANDAGES/DRESSINGS) ×2 IMPLANT
BINDER ABD UNIV 10 28-50 (GAUZE/BANDAGES/DRESSINGS) IMPLANT
BINDER ABD UNIV 12 45-62 (WOUND CARE) IMPLANT
BINDER ABDOM UNIV 10 (GAUZE/BANDAGES/DRESSINGS)
BINDER ABDOMINAL 46IN 62IN (WOUND CARE)
CLAMP CORD UMBIL (MISCELLANEOUS) IMPLANT
CLOTH BEACON ORANGE TIMEOUT ST (SAFETY) ×2 IMPLANT
DRAPE LG THREE QUARTER DISP (DRAPES) IMPLANT
DRESSING TELFA 8X3 (GAUZE/BANDAGES/DRESSINGS) ×2 IMPLANT
DRSG OPSITE POSTOP 4X10 (GAUZE/BANDAGES/DRESSINGS) ×2 IMPLANT
DURAPREP 26ML APPLICATOR (WOUND CARE) ×2 IMPLANT
ELECT REM PT RETURN 9FT ADLT (ELECTROSURGICAL) ×2
ELECTRODE REM PT RTRN 9FT ADLT (ELECTROSURGICAL) ×1 IMPLANT
EXTRACTOR VACUUM KIWI (MISCELLANEOUS) IMPLANT
GLOVE BIO SURGEON STRL SZ7 (GLOVE) ×2 IMPLANT
GLOVE BIOGEL PI IND STRL 7.0 (GLOVE) ×1 IMPLANT
GLOVE BIOGEL PI INDICATOR 7.0 (GLOVE) ×1
GOWN STRL REUS W/TWL LRG LVL3 (GOWN DISPOSABLE) ×4 IMPLANT
KIT ABG SYR 3ML LUER SLIP (SYRINGE) ×2 IMPLANT
NEEDLE HYPO 22GX1.5 SAFETY (NEEDLE) ×2 IMPLANT
NEEDLE HYPO 25X5/8 SAFETYGLIDE (NEEDLE) ×2 IMPLANT
NS IRRIG 1000ML POUR BTL (IV SOLUTION) ×2 IMPLANT
PACK C SECTION WH (CUSTOM PROCEDURE TRAY) ×2 IMPLANT
PAD OB MATERNITY 4.3X12.25 (PERSONAL CARE ITEMS) ×2 IMPLANT
RTRCTR C-SECT PINK 25CM LRG (MISCELLANEOUS) IMPLANT
SPONGE GAUZE 4X4 12PLY (GAUZE/BANDAGES/DRESSINGS) ×2 IMPLANT
SPONGE SURGIFOAM ABS GEL 12-7 (HEMOSTASIS) IMPLANT
STAPLER VISISTAT 35W (STAPLE) IMPLANT
STRIP CLOSURE SKIN 1/2X4 (GAUZE/BANDAGES/DRESSINGS) ×2 IMPLANT
SUT PDS AB 0 CTX 60 (SUTURE) IMPLANT
SUT PLAIN 0 NONE (SUTURE) IMPLANT
SUT SILK 0 TIES 10X30 (SUTURE) IMPLANT
SUT VIC AB 0 CT1 36 (SUTURE) ×6 IMPLANT
SUT VIC AB 3-0 CT1 27 (SUTURE) ×1
SUT VIC AB 3-0 CT1 TAPERPNT 27 (SUTURE) ×1 IMPLANT
SUT VIC AB 4-0 KS 27 (SUTURE) ×2 IMPLANT
SYR CONTROL 10ML LL (SYRINGE) ×2 IMPLANT
TAPE CLOTH SURG 4X10 WHT LF (GAUZE/BANDAGES/DRESSINGS) ×2 IMPLANT
TOWEL OR 17X24 6PK STRL BLUE (TOWEL DISPOSABLE) ×2 IMPLANT
TRAY FOLEY CATH 14FR (SET/KITS/TRAYS/PACK) ×2 IMPLANT

## 2013-08-31 NOTE — Progress Notes (Addendum)
CNM and Dr. Erin FullingHarraway Smith reviewed FHR tracing and contraction pattern. Discontinue pitocin. Restart pitocin at 4pm and use new bag of pitocin. Restart pitocin infusion at 10 milli-units/min.

## 2013-08-31 NOTE — Progress Notes (Signed)
Pamela Howard is a 28 y.o. G2P1001 at 313w4d by ultrasound admitted for rupture of membranes.   Late entry (discussion PRIOR to c-section)   Subjective: I was called to see pt for fetal tachycardia in the 180's. Atbx were ordered but by the time I arrived the FHR was in the 210's and the variability had resolved.  Pt with no additional complaints.  Objective: BP 146/102  Pulse 152  Temp(Src) 98.9 F (37.2 C) (Oral)  Resp 22  Ht 5\' 2"  (1.575 m)  Wt 236 lb (107.049 kg)  BMI 43.15 kg/m2  LMP 12/07/2012   Total I/O In: 1500 [I.V.:1500] Out: 2300 [Urine:1500; Blood:800]  FHT:  FHR: 210's bpm, variability: minimal ,  accelerations:  Abscent,  decelerations:  Absent UC:   regular, every 1-2 minutes.  Not adequate by MVU's SVE:   Dilation: 6 Effacement (%): 70 Station: -3 Exam by:: Dr.Harraway-Smith  Labs: Lab Results  Component Value Date   WBC 11.4* 08/31/2013   HGB 12.8 08/31/2013   HCT 37.8 08/31/2013   MCV 92.2 08/31/2013   PLT 177 08/31/2013    Assessment / Plan: augmentation of labor with no cervical change since early this am.  Now the fetus has worsening fetal tachycardia.  Recommended that we proceed with primary c-section.   Patient desires surgical management with promary c-section.  The risks of surgery were discussed in detail with the patient including but not limited to: bleeding which may require transfusion or reoperation; infection which may require prolonged hospitalization or re-hospitalization and antibiotic therapy; injury to bowel, bladder, ureters and major vessels or other surrounding organs; need for additional procedures including laparotomy; thromboembolic phenomenon, incisional problems and other postoperative or anesthesia complications.  Patient was told that the likelihood that her condition and symptoms will be treated effectively with this surgical management was very high; the postoperative expectations were also discussed in detail. The patient also  understands the alternative treatment options which were discussed in full. All questions were answered.      Pamela RosenthalCarolyn Harraway-Smith 08/31/2013, 6:15 PM

## 2013-08-31 NOTE — Anesthesia Postprocedure Evaluation (Signed)
  Anesthesia Post-op Note  Anesthesia Post Note  Patient: [name]  Procedure(s) Performed: Procedure(s) (LRB): CESAREAN SECTION (N/A)  Anesthesia type: Epidural  Patient location: PACU  Post pain: Pain level controlled  Post assessment: Post-op Vital signs reviewed  Last Vitals:  Filed Vitals:   08/31/13 2015  BP: 125/68  Pulse: 94  Temp: 36.9 C  Resp: 18    Post vital signs: stable  Level of consciousness: awake  Complications: No apparent anesthesia complications

## 2013-08-31 NOTE — Progress Notes (Signed)
ANTIBIOTIC CONSULT NOTE - INITIAL  Pharmacy Consult for Gentamicin Indication:   No Known Allergies  Patient Measurements: Height: 5\' 2"  (157.5 cm) Weight: 236 lb (107.049 kg) IBW/kg (Calculated) : 50.1 Adjusted Body Weight: 67.2kg  Vital Signs: Temp: 99 F (37.2 C) (05/05 1640) Temp src: Oral (05/05 1640) BP: 140/91 mmHg (05/05 1659) Pulse Rate: 126 (05/05 1659)     Labs:  Recent Labs  08/30/13 2000 08/30/13 2150 08/31/13 0842  WBC 10.1  --  11.4*  HGB 13.0  --  12.8  PLT 216  --  177  LABCREA  --  80.49  --   CREATININE 0.58  --   --    Estimated Creatinine Clearance: 123.7 ml/min (by C-G formula based on Cr of 0.58).   Microbiology: Recent Results (from the past 720 hour(s))  OB RESULTS CONSOLE GBS     Status: None   Collection Time    08/13/13 12:00 AM      Result Value Ref Range Status   GBS Negative   Final  CULTURE, BETA STREP (GROUP B ONLY)     Status: None   Collection Time    08/13/13  9:34 AM      Result Value Ref Range Status   Organism ID, Bacteria NO GROUP B STREP (S.AGALACTIAE) ISOLATED   Final  GC/CHLAMYDIA PROBE AMP     Status: None   Collection Time    08/13/13  9:34 AM      Result Value Ref Range Status   CT Probe RNA NEGATIVE   Final   GC Probe RNA NEGATIVE   Final   Comment:                                                                                             **Normal Reference Range: Negative**                 Assay performed using the Gen-Probe APTIMA COMBO2 (R) Assay.           Acceptable specimen types for this assay include APTIMA Swabs (Unisex,     endocervical, urethral, or vaginal), first void urine, and ThinPrep     liquid based cytology samples.    Medical History: Past Medical History  Diagnosis Date  . Headache     migraines  . Herpes   . Obesity   . Anxiety   . Gestational hypertension   . Genital herpes     Medications:  Ampicillin 2 gram IV q6h Assessment: 28yo 38+ weeks admitted [facility]OL [facility]. Ampicillin and Gentamicin initiated due to prolonged ROM and maternal temp during labor.  Goal of Therapy:  Gentamicin peaks 6-348mcg/ml  And trough < 1 mcg/ml  Plan:  1. Gentamicin 170mg  IV q8h. 2. Will continue to follow and assess need for further kinetic workup based on duration and pt's status.  Thanks!  Claybon JabsAndrea G Decklyn Hornik 08/31/2013,5:28 PM

## 2013-08-31 NOTE — Progress Notes (Signed)
I examined pt and agree with documentation above and resident plan of care. Aivah Putman N Muhammad, CNM  

## 2013-08-31 NOTE — Anesthesia Preprocedure Evaluation (Signed)
Anesthesia Evaluation  Patient identified by MRN, date of birth, ID band Patient awake    Reviewed: Allergy & Precautions, H&P , Patient's Chart, lab work & pertinent test results  Airway Mallampati: III TM Distance: >3 FB Neck ROM: Full    Dental no notable dental hx. (+) Teeth Intact   Pulmonary neg pulmonary ROS,  breath sounds clear to auscultation  Pulmonary exam normal       Cardiovascular hypertension, Rhythm:Regular Rate:Normal  Gestational HTN   Neuro/Psych  Headaches, Anxiety    GI/Hepatic negative GI ROS, Neg liver ROS,   Endo/Other  Morbid obesity  Renal/GU negative Renal ROS  negative genitourinary   Musculoskeletal negative musculoskeletal ROS (+)   Abdominal (+) + obese,   Peds  Hematology negative hematology ROS (+)   Anesthesia Other Findings   Reproductive/Obstetrics (+) Pregnancy Genital Herpes                           Anesthesia Physical Anesthesia Plan  ASA: III  Anesthesia Plan: Epidural   Post-op Pain Management:    Induction:   Airway Management Planned: Natural Airway  Additional Equipment:   Intra-op Plan:   Post-operative Plan:   Informed Consent: I have reviewed the patients History and Physical, chart, labs and discussed the procedure including the risks, benefits and alternatives for the proposed anesthesia with the patient or authorized representative who has indicated his/her understanding and acceptance.     Plan Discussed with: Anesthesiologist and CRNA  Anesthesia Plan Comments:         Anesthesia Quick Evaluation

## 2013-08-31 NOTE — Progress Notes (Signed)
  Subjective: Pt reports feeling pain with epidural.    Objective: BP 119/68  Pulse 99  Temp(Src) 98.4 F (36.9 C) (Oral)  Resp 20  Ht 5\' 2"  (1.575 m)  Wt 107.049 kg (236 lb)  BMI 43.15 kg/m2  LMP 12/07/2012      FHT:  FHR: 140's bpm, variability: moderate,  accelerations:  Present,  decelerations:  Absent UC:   MVU 130-150 SVE:   Dilation: 6 Effacement (%): 70 Station: -3 Exam by:: Camelia EngK. Haynes, RN Dark red blood, no clots Labs: Lab Results  Component Value Date   WBC 11.4* 08/31/2013   HGB 12.8 08/31/2013   HCT 37.8 08/31/2013   MCV 92.2 08/31/2013   PLT 177 08/31/2013    Assessment / Plan: Induction of labor due to gestational hypertension,  progressing well on pitocin  Reviewed FHR tracing and contractions with Dr Erin FullingHarraway-Smith.   Labor: Inadequate Contractions, increase pitocin. Preeclampsia:  Asymptomatic Fetal Wellbeing:  Category I Pain Control:  Epidural I/D:  GBS neg Anticipated MOD:  NSVD  Pamela Howard 08/31/2013, 1:11 PM

## 2013-08-31 NOTE — Progress Notes (Signed)
Pamela SalvageKilisha Howard is a 28 y.o. female G2P1001 with IUP at 7254w3d presenting for IOL GHTN  Subjective: Comfortable s/p epidural. No longer feeling contractions. Only admits to feeling pressure from IUPC  Objective: BP 133/90  Pulse 90  Temp(Src) 98.2 F (36.8 C) (Oral)  Resp 20  Ht 5\' 2"  (1.575 m)  Wt 107.049 kg (236 lb)  BMI 43.15 kg/m2  LMP 12/07/2012      FHT:  Baseline 140s, +accels, minimal variability, no decels, category II tracing (unchanged) UC:   Regular UCs q 2 min SVE:   Dilation: 6 Effacement (%): 70 Station: -3 Exam by:: Camelia EngK. Haynes, RN  Labs: Lab Results  Component Value Date   WBC 11.4* 08/31/2013   HGB 12.8 08/31/2013   HCT 37.8 08/31/2013   MCV 92.2 08/31/2013   PLT 177 08/31/2013    Assessment / Plan: Pamela SalvageKilisha Kleiner is a 28 y.o. G2P1001 at 1254w3d  here for IOL for GHTN  Labor: IOL, s/p FB out, overall good progression, minimal change x 3 hours, continue Pitocin @ 16mU, no increase d/t ctx q 1.5 to 2 min, IUPC in place ctx about 50MVU, ~200 in 10 min Preeclampsia:  BP stable, Asymptomatic Fetal Wellbeing:  Category II tracing due to minimal variability  Pain Control:  Epidural I/D:  GBS negative Anticipated MOD:  NSVD  Saralyn PilarAlexander Karamalegos, DO [facility], PGY-1 08/31/2013, 10:34 AM

## 2013-08-31 NOTE — Progress Notes (Signed)
Maternal pulse tracing from [id number].

## 2013-08-31 NOTE — Progress Notes (Addendum)
CNM reviewed FHR and contraction pattern-continue to increase pitocin

## 2013-08-31 NOTE — Progress Notes (Signed)
Pamela Howard is a 28 y.o. female G2P1001 with IUP at 2865w3d presenting for IOL GHTN  Subjective: Pt is sleeping comfortably.  Objective: BP 139/104  Pulse 89  Temp(Src) 98.2 F (36.8 C) (Oral)  Resp 20  Ht 5\' 2"  (1.575 m)  Wt 107.049 kg (236 lb)  BMI 43.15 kg/m2  LMP 12/07/2012      FHT:  Baseline 140s, + accels, minimal variability, no decels, category II tracing UC:   irregular SVE:   Dilation: 5 Effacement (%): 40 Station: -3 Exam by:: AvnetFarmer RN  Labs: Lab Results  Component Value Date   WBC 10.1 08/30/2013   HGB 13.0 08/30/2013   HCT 39.5 08/30/2013   MCV 91.9 08/30/2013   PLT 216 08/30/2013    Assessment / Plan: Pamela SalvageKilisha Howard is a 28 y.o. G2P1001 at 7065w3d  here for IOL for GHTN   Labor: Now on 12mU pitocin Preeclampsia:  BP stable, Asymptomatic Fetal Wellbeing:  Category II tracing due to minimal variability  Pain Control:  Desires IV pain meds then epidural I/D:  GBS negative Anticipated MOD:  NSVD  Robyn H Restrepo 08/31/2013, 6:52 AM  Dilation: 6 Effacement (%): 70 Cervical Position: Posterior Station: -2 Presentation: Vertex Exam by:: Dr. Ike Benedom I placed an IUPC as well  I spoke with and examined patient and agree with resident's note and plan of care.  Tawana ScaleMichael Ryan Rinaldo Macqueen, MD OB Fellow 08/31/2013 9:02 AM

## 2013-08-31 NOTE — Op Note (Signed)
08/30/2013 - 08/31/2013  6:20 PM  PATIENT:  Pamela Howard  28 y.o. female  PRE-OPERATIVE DIAGNOSIS:  fetal intolerance of labor; fetal tachycardia  POST-OPERATIVE DIAGNOSIS:  fetal intolerance of labor; fetal tachycardia  PROCEDURE:  Procedure(s): CESAREAN SECTION (N/A)  SURGEON:  Surgeon(s) and Role:    * Willodean Rosenthalarolyn Harraway-Smith, MD - Primary  ASSISTANTS: Rulon AbideKeli Beck, M.D.   ANESTHESIA:   epidural  EBL:  Total I/O In: 1500 [I.V.:1500] Out: 2500 [Urine:1700; Blood:800]  BLOOD ADMINISTERED:none  DRAINS: none   LOCAL MEDICATIONS USED:  MARCAINE     SPECIMEN:  Source of Specimen:  placenta  DISPOSITION OF SPECIMEN:  PATHOLOGY  COUNTS:  YES  TOURNIQUET:  * No tourniquets in log *  DICTATION: .Note written in EPIC  PLAN OF CARE: Admit to inpatient   PATIENT DISPOSITION:  PACU - hemodynamically stable.   Delay start of Pharmacological VTE agent (>24hrs) due to surgical blood loss or risk of bleeding: yes   INDICATIONS: Pamela Howard is a 28 y.o. G2P1001 at 5910w4d here for cesarean section secondary to the indications listed under preoperative diagnosis; please see preoperative note for further details.  The risks of cesarean section were discussed with the patient including but were not limited to: bleeding which may require transfusion or reoperation; infection which may require antibiotics; injury to bowel, bladder, ureters or other surrounding organs; injury to the fetus; need for additional procedures including hysterectomy in the event of a life-threatening hemorrhage; placental abnormalities wth subsequent pregnancies, incisional problems, thromboembolic phenomenon and other postoperative/anesthesia complications.   The patient concurred with the proposed plan, giving informed written consent for the procedure.    FINDINGS:  Viable female infant in cephalic presentation. The infant was warm to the touch. Apgars 9 and 9.  Cord pH 7.29 Clear amniotic fluid.  Intact placenta,  three vessel cord.  Normal uterus, fallopian tubes and ovaries bilaterally.  PROCEDURE IN DETAIL:  The patient preoperatively received intravenous antibiotics and had sequential compression devices applied to her lower extremities.  She was then taken to the operating room where her previously placed epidural anesthesia was found to be adequate. She was then placed in a dorsal supine position with a leftward tilt, and prepped and draped in a sterile manner.  A foley catheter was placed into her bladder and attached to constant gravity.  After an adequate timeout was performed, a Pfannenstiel skin incision was made with scalpel and carried through to the underlying layer of fascia. The fascia was incised in the midline, and this incision was extended bilaterally using the Mayo scissors.  Kocher clamps were applied to the superior aspect of the fascial incision and the underlying rectus muscles were dissected off bluntly. A similar process was carried out on the inferior aspect of the fascial incision. The rectus muscles were separated in the midline bluntly and the peritoneum was entered bluntly. Attention was turned to the lower uterine segment where a low transverse hysterotomy incision was made with a scalpel and extended bilaterally bluntly.  The infant was successfully delivered, the cord was clamped and cut and the infant was handed over to awaiting neonatology team. Uterine massage was then administered, and the placenta delivered intact with a three-vessel cord. The uterus was then cleared of clot and debris.  The hysterotomy was closed with 0 Vicryl in a running locked fashion, and an imbricating layer was also placed with the same suture. Here was a hematoma on the left corner and figure-of -eight sutures were placed and hemostasis was  confirmed. The pelvis was cleared of all clot and debris. Hemostasis was confirmed on all surfaces.  The peritoneum and the muscles were reapproximated using 0 Vicryl in 1  interrupted suture. The fascia was then closed using 0 Vicryl in a running fashion.  The subcutaneous layer was irrigated, then reapproximated with 3-0 vicryl in a running locked fashion, and the skin was closed with a 4-0 Vicryl subcuticular stitch.  30 cc of 0.5% marcaine was injected into the incision and benzoin and steristrips were applied.  The patient tolerated the procedure well. Sponge, lap, instrument and needle counts were correct x 2.  She was taken to the recovery room in stable condition.

## 2013-08-31 NOTE — Progress Notes (Signed)
  Subjective: Reports comfortable with contractions.  No preX symptoms.    Objective: BP 132/92  Pulse 120  Temp(Src) 99 F (37.2 C) (Oral)  Resp 20  Ht 5\' 2"  (1.575 m)  Wt 107.049 kg (236 lb)  BMI 43.15 kg/m2  LMP 12/07/2012      FHT:  FHR: 170's bpm, variability: minimal ,  accelerations:  Abscent,  decelerations:  Absent UC:   Regular; MVU 135 SVE:   Dilation: 5.5 Effacement (%): 70 Station: -3 Exam by:: Dr. Burnice LoganHarraway- Katrinka BlazingSmith  Labs: Lab Results  Component Value Date   WBC 11.4* 08/31/2013   HGB 12.8 08/31/2013   HCT 37.8 08/31/2013   MCV 92.2 08/31/2013   PLT 177 08/31/2013    Assessment / Plan:  28 yo G2P1001 at 3667w4d wks IUP Induction of labor due to gestational hypertension,   Fetal Tachycardia  Labor: Inadequate Labor Preeclampsia:  Asymptomatic Fetal Wellbeing:  Category II Pain Control:  Epidural I/D:  GBS neg Anticipated MOD:  NSVD  Melissa NoonWalidah N Muhammad 08/31/2013, 5:06 PM

## 2013-08-31 NOTE — Brief Op Note (Signed)
08/30/2013 - 08/31/2013  6:20 PM  PATIENT:  Marrian SalvageKilisha Roswell  28 y.o. female  PRE-OPERATIVE DIAGNOSIS:  fetal intolerance of labor  POST-OPERATIVE DIAGNOSIS:  fetal intolerance of labor  PROCEDURE:  Procedure(s): CESAREAN SECTION (N/A)  SURGEON:  Surgeon(s) and Role:    * Willodean Rosenthalarolyn Harraway-Smith, MD - Primary  ASSISTANTS: Rulon AbideKeli Beck, M.D.   ANESTHESIA:   epidural  EBL:  Total I/O In: 1500 [I.V.:1500] Out: 2500 [Urine:1700; Blood:800]  BLOOD ADMINISTERED:none  DRAINS: none   LOCAL MEDICATIONS USED:  MARCAINE     SPECIMEN:  Source of Specimen:  placenta  DISPOSITION OF SPECIMEN:  PATHOLOGY  COUNTS:  YES  TOURNIQUET:  * No tourniquets in log *  DICTATION: .Note written in EPIC  PLAN OF CARE: Admit to inpatient   PATIENT DISPOSITION:  PACU - hemodynamically stable.   Delay start of Pharmacological VTE agent (>24hrs) due to surgical blood loss or risk of bleeding: yes

## 2013-08-31 NOTE — Consult Note (Signed)
Neonatology Note:  Attendance at C-section:  I was asked by Dr. Harraway-Smith to attend this primary C/S at term due to fetal tachycardia. The mother is a G2P1 O pos, GBS neg with fetal tachycardia and maternal temperature elevation to 99.1 degrees. 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 ROM 18 hours prior to delivery, fluid clear. Infant vigorous with good spontaneous cry and tone. Needed only minimal bulb suctioning. Ap 9/9. Lungs clear to ausc in DR. Baby's temp 100.7 at delivery, mother 98.7 in OR. To CN to care of Pediatrician.  Alfard Cochrane C. Dejan Angert, MD  

## 2013-08-31 NOTE — Anesthesia Procedure Notes (Signed)
Epidural Patient location during procedure: OB Start time: 08/31/2013 9:24 AM  Staffing Anesthesiologist: Buna Cuppett A. Performed by: anesthesiologist   Preanesthetic Checklist Completed: patient identified, site marked, surgical consent, pre-op evaluation, timeout performed, IV checked, risks and benefits discussed and monitors and equipment checked  Epidural Patient position: sitting Prep: site prepped and draped and DuraPrep Patient monitoring: continuous pulse ox and blood pressure Approach: midline Location: L3-L4 Injection technique: LOR air  Needle:  Needle type: Tuohy  Needle gauge: 17 G Needle length: 9 cm and 9 Needle insertion depth: 8 cm Catheter type: closed end flexible Catheter size: 19 Gauge Catheter at skin depth: 13 cm Test dose: negative and Other  Assessment Events: blood not aspirated, injection not painful, no injection resistance, negative IV test and no paresthesia  Additional Notes Patient identified. Risks and benefits discussed including failed block, incomplete  Pain control, post dural puncture headache, nerve damage, paralysis, blood pressure Changes, nausea, vomiting, reactions to medications-both toxic and allergic and post Partum back pain. All questions were answered. Patient expressed understanding and wished to proceed. Sterile technique was used throughout procedure. Epidural site was Dressed with sterile barrier dressing. No paresthesias, signs of intravascular injection Or signs of intrathecal spread were encountered.  Patient was more comfortable after the epidural was dosed. Please see RN's note for documentation of vital signs and FHR which are stable.

## 2013-08-31 NOTE — Progress Notes (Addendum)
CNM notified of FHR change-still requested to increase pitocin

## 2013-08-31 NOTE — Transfer of Care (Signed)
Immediate Anesthesia Transfer of Care Note  Patient: Pamela Howard  Procedure(s) Performed: Procedure(s): CESAREAN SECTION (N/A)  Patient Location: PACU  Anesthesia Type:Epidural  Level of Consciousness: awake, alert  and sedated  Airway & Oxygen Therapy: Patient Spontanous Breathing  Post-op Assessment: Report given to PACU RN and Post -op Vital signs reviewed and stable  Post vital signs: Reviewed and stable  Complications: No apparent anesthesia complications

## 2013-09-01 ENCOUNTER — Encounter (HOSPITAL_COMMUNITY): Payer: Self-pay | Admitting: Obstetrics & Gynecology

## 2013-09-01 LAB — CBC
HEMATOCRIT: 33.8 % — AB (ref 36.0–46.0)
Hemoglobin: 11.2 g/dL — ABNORMAL LOW (ref 12.0–15.0)
MCH: 30.7 pg (ref 26.0–34.0)
MCHC: 33.1 g/dL (ref 30.0–36.0)
MCV: 92.6 fL (ref 78.0–100.0)
PLATELETS: 162 10*3/uL (ref 150–400)
RBC: 3.65 MIL/uL — ABNORMAL LOW (ref 3.87–5.11)
RDW: 14.6 % (ref 11.5–15.5)
WBC: 22 10*3/uL — AB (ref 4.0–10.5)

## 2013-09-01 NOTE — Clinical Social Work Maternal (Signed)
    Clinical Social Work Department PSYCHOSOCIAL ASSESSMENT - MATERNAL/CHILD 09/01/2013  Patient:  Pamela Howard,Pamela Howard  Account Number:  [id number]  Admit Date:  08/30/2013  Pamela Howard Name:   Pamela Howard, Jr.    Clinical Social Worker:  Pamela PutnamEDRA Ronald Vinsant, Pamela Howard   Date/Time:  09/01/2013 11:58 AM  Date Referred:  09/01/2013   Referral source  CN     Referred reason  Substance Abuse  Depression/Anxiety   Other referral source:    I:  FAMILY / HOME ENVIRONMENT Child's legal guardian:  PARENT  Guardian - Name Guardian - Age Guardian - Address  Pamela Howard [address] [place], [place] [id number]  Pamela Howard 28    Other household support members/support persons Name Relationship DOB  Pamela Howard DAUGHTER 26 years old   Other support:   Pamela Howard- pts grandmother    II  PSYCHOSOCIAL DATA Information Source:  Patient Interview  Insurance claims handlerinancial and Community Resources Employment:   [employer]   Financial resources:  Media plannerrivate Insurance If [employer] [place]icaid - [place]:   Other  [facility]   School / Grade:   Maternity Care Coordinator / Child Services Coordination / Early Interventions:  Cultural issues impacting care:    III  STRENGTHS Strengths  Adequate Resources  Home prepared for Child (including basic supplies)  Supportive family/friends   Strength comment:    IV  RISK FACTORS AND CURRENT PROBLEMS Current Problem:  YES   Risk Factor & Current Problem Patient Issue Family Issue Risk Factor / Current Problem Comment  Substance Abuse Y N Hx of MJ use  Mental Illness Y N Hx of anxiety    V  SOCIAL WORK ASSESSMENT CSW consult received to assess history of anxiety & MJ use.  Pt denies that she was every diagnosed or experienced anxious symptoms.  Pt also denies any MJ use, as she stated "I'm around it a lot."  Pt explained that the FOB smokes all the time & they spend a lot of time together.  Per admission summary, MJ use noted.  Pt denies other illegal  substance use.  UDS collection is pending, meconium results are pending.  Upon discharge, pt plans to [place] to [place] until she returns to work in 8 weeks.  FOB is involved & supportive, per pt.  She has all the necessary supplies for the infant & appears to be bonding well.  CSW will continue to monitor drug screen results & make a referral if needed.       VI SOCIAL WORK PLAN Social Work Plan  No Further Intervention Required / No Barriers to Discharge   Type of pt/family education:   If child protective services report - county:   If child protective services report - date:   Information/referral to community resources comment:   Other social work plan:

## 2013-09-01 NOTE — Lactation Note (Signed)
This note was copied from the chart of Pamela TransMontaigneKilisha Wawrzyniak. Lactation Consultation Note Mom BF baby in football position wrapped in 2 blankets and hat. Encouraged to unwrap baby and feed STS and baby may be more alert and interested in feeding. Baby didn't appear to be interested in feeding. Mom leaning down to baby, encouraged mom to bring baby to her. Encouraged football position for deeper latch. Mom stated she BF first child 5 months and didn't need any assistance BF. Reviewed Baby & Me book's Breastfeeding Basics. Mom encouraged to feed baby w/feeding cuesMom made aware of O/P services, breastfeeding support groups, community resources, and our phone # for post-discharge questions. Mom encouraged to feed baby 8-12 times/24 hours and with feeding cues. Mother informed of post-discharge support and given phone number to the lactation department, including services for phone call assistance; out-patient appointments; and breastfeeding support group. List of other breastfeeding resources in the community given in the handout. Encouraged mother to call for problems or concerns related to breastfeeding.WH/LC brochure given w/resources, support groups and LC services.Encouraged to call for assistance if needed and to verify proper latch. Patient Name: Pamela Howard Reason for consult: Initial assessment   Maternal Data Has patient been taught Hand Expression?: Yes Does the patient have breastfeeding experience prior to this delivery?: Yes  Feeding Feeding Type: Breast Fed  LATCH Score/Interventions Latch: Repeated attempts needed to sustain latch, nipple held in mouth throughout feeding, stimulation needed to elicit sucking reflex. Intervention(s): Adjust position;Assist with latch  Audible Swallowing: None Intervention(s): Hand expression  Type of Nipple: Everted at rest and after stimulation  Comfort (Breast/Nipple): Soft / non-tender     Hold (Positioning):  No assistance needed to correctly position infant at breast. Intervention(s): Breastfeeding basics reviewed;Support Pillows;Position options;Skin to skin  LATCH Score: 7  Lactation Tools Discussed/Used     Consult Status Consult Status: Follow-up Date: 09/01/13 Follow-up type: In-patient    Charyl DancerLaura G Jamyiah Labella Howard, 1:30 AM

## 2013-09-01 NOTE — Anesthesia Postprocedure Evaluation (Signed)
Anesthesia Post Note  Patient: [name]  Procedure(s) Performed: Procedure(s) (LRB): CESAREAN SECTION (N/A)  Anesthesia type: Epidural  Patient location: Mother/Baby  Post pain: Pain level controlled  Post assessment: Post-op Vital signs reviewed  Last Vitals:  Filed Vitals:   09/01/13 0530  BP: 111/70  Pulse:   Temp: 37.1 C  Resp:     Post vital signs: Reviewed  Level of consciousness:alert  Complications: No apparent anesthesia complications

## 2013-09-01 NOTE — Progress Notes (Signed)
Subjective: Postpartum Day 1: Cesarean Delivery Patient reports incisional pain, tolerating PO and + flatus.  Ambulating, urinating, no significant pain at this time.  Objective: Vital signs in last 24 hours: Temp:  [97.6 F (36.4 C)-99.5 F (37.5 C)] 98.7 F (37.1 C) (05/06 0530) Pulse Rate:  [88-152] 99 (05/06 0322) Resp:  [17-25] 20 (05/06 0116) BP: (84-165)/(40-119) 111/70 mmHg (05/06 0530) SpO2:  [95 %-100 %] 97 % (05/06 0322)  Physical Exam:  General: alert, cooperative, appears stated age and no distress Lochia: appropriate Uterine Fundus: firm Incision: c/d/i - pressure dressing in place DVT Evaluation: No evidence of DVT seen on physical exam. Negative Homan's sign. No cords or calf tenderness. No significant calf/ankle edema.   Recent Labs  08/31/13 0842 09/01/13 0554  HGB 12.8 11.2*  HCT 37.8 33.8*    Assessment/Plan: Status post Cesarean section. Doing well postoperatively.  Continue current care. Breast feeding, MOC LARC  Pamela BalsamMichael R Kataleah Howard 09/01/2013, 9:04 AM

## 2013-09-01 NOTE — Addendum Note (Signed)
Addendum created 09/01/13 0746 by Algis GreenhouseLinda A Able Malloy, CRNA   Modules edited: Notes Section   Notes Section:  File: [id number]

## 2013-09-01 NOTE — Lactation Note (Signed)
This note was copied from the chart of Pamela TransMontaigneKilisha Benton. Lactation Consultation Note Mom currently breastfeeding baby with good latch and active suck/swallow observed.  Reviewed waking techniques and alternate breast massage.  Mom denies questions or concerns.  Encouraged mom to call for breastfeeding concerns/assist prn. Patient Name: Pamela Howard ZOXWR'UToday's Date: 09/01/2013 Reason for consult: Follow-up assessment;Infant < 6lbs   Maternal Data    Feeding Feeding Type: Breast Fed  LATCH Score/Interventions Latch: Grasps breast easily, tongue down, lips flanged, rhythmical sucking.  Audible Swallowing: Spontaneous and intermittent Intervention(s): Alternate breast massage  Type of Nipple: Everted at rest and after stimulation  Comfort (Breast/Nipple): Soft / non-tender     Hold (Positioning): No assistance needed to correctly position infant at breast. Intervention(s): Breastfeeding basics reviewed  LATCH Score: 10  Lactation Tools Discussed/Used     Consult Status Consult Status: PRN    Hansel FeinsteinLaura Ann Powell 09/01/2013, 12:55 PM

## 2013-09-02 MED ORDER — DOCUSATE SODIUM 100 MG PO CAPS: 100.0000 mg | ORAL_CAPSULE | Freq: Two times a day (BID) | ORAL | Status: DC

## 2013-09-02 MED ORDER — IBUPROFEN 600 MG PO TABS: 600.0000 mg | ORAL_TABLET | Freq: Four times a day (QID) | ORAL | Status: DC

## 2013-09-02 MED ORDER — OXYCODONE-ACETAMINOPHEN 5-325 MG PO TABS: 1.0000 | ORAL_TABLET | ORAL | Status: DC | PRN

## 2013-09-02 NOTE — Discharge Summary (Signed)
Physician Obstetric Discharge Summary  Patient ID: Pamela Howard MRN: [id number] DOB/AGE: 1986-04-23 28 y.o.  Reason for Admission: induction of labor Prenatal Procedures: none Intrapartum Procedures: cesarean: low cervical, transverse Postpartum Procedures: none Complications-Operative and Postpartum: none  FINDINGS: Viable female infant in cephalic presentation. The infant was warm to the touch. Apgars 9 and 9. Cord pH 7.29 Clear amniotic fluid. Intact placenta, three vessel cord. Normal uterus, fallopian tubes and ovaries bilaterally.  Pt went to for PLTCS after fetal tachycardia  See op not for details       APGAR: , ; weight .    H/H:  Lab Results  Component Value Date/Time   HGB 11.2* 09/01/2013  5:54 AM   HCT 33.8* 09/01/2013  5:54 AM    Brief Hospital Course: Pamela SalvageKilisha Dickens is a [id number] who underwent cesarean section on 08/30/2013 - 08/31/2013.  Patient had an uncomplicated surgery; for further details of this surgery, please refer to the operative note.  Patient had an uncomplicated postpartum course.  By time of discharge on POD/PPD2, her pain was controlled on oral pain medications; she had appropriate lochia and was ambulating, voiding without difficulty, tolerating regular diet and passing flatus.   She was deemed stable for discharge to home.    Physical Exam:  General: alert, cooperative and no distress Lochia: appropriate Uterine Fundus: firm Incision: healing well, no significant drainage DVT Evaluation: No evidence of DVT seen on physical exam. Negative Homan's sign. No cords or calf tenderness. No significant calf/ankle edema.  Discharge Diagnoses: Term PLTCS  Discharge Information: Date: 09/02/2013 Activity: pelvic rest Diet: routine Baby feeding: plans to breastfeed Contraception: IUD Medications: prenatal vitamins, motrin Discharged Condition: good Instructions: refer to practice specific booklet Discharge to: home and pt will go to [place] 8 weeks until she starts working again.   A/P 1. Previous substance abuse in mom. Waiting on UDS and will consult accordingly 2. Substance abuse in FOB, but is supportive and involved in pregnancy 3. Pt has spoken with social worker   Signed: Lawernce PittsDrew Venables PA-S1 09/02/2013, 7:57 AM  I spoke with and examined patient and agree with PA-S's note and plan of care.  Tawana ScaleMichael Ryan Kimaria Struthers, MD Ob Fellow 09/02/2013 9:06 AM

## 2013-09-02 NOTE — Discharge Instructions (Signed)
Postpartum Care After Cesarean Delivery °After you deliver your newborn (postpartum period), the usual stay in the hospital is 24 72 hours. If there were problems with your labor or delivery, or if you have other medical problems, you might be in the hospital longer.  °While you are in the hospital, you will receive help and instructions on how to care for yourself and your newborn during the postpartum period.  °While you are in the hospital: °· It is normal for you to have pain or discomfort from the incision in your abdomen. Be sure to tell your nurses when you are having pain, where the pain is located, and what makes the pain worse. °· If you are breastfeeding, you may feel uncomfortable contractions of your uterus for a couple of weeks. This is normal. The contractions help your uterus get back to normal size. °· It is normal to have some bleeding after delivery. °· For the first 1 3 days after delivery, the flow is red and the amount may be similar to a period. °· It is common for the flow to start and stop. °· In the first few days, you may pass some small clots. Let your nurses know if you begin to pass large clots or your flow increases. °· Do not  flush blood clots down the toilet before having the nurse look at them. °· During the next 3 10 days after delivery, your flow should become more watery and pink or brown-tinged in color. °· Ten to fourteen days after delivery, your flow should be a small amount of yellowish-white discharge. °· The amount of your flow will decrease over the first few weeks after delivery. Your flow may stop in 6 8 weeks. Most women have had their flow stop by 12 weeks after delivery. °· You should change your sanitary pads frequently. °· Wash your hands thoroughly with soap and water for at least 20 seconds after changing pads, using the toilet, or before holding or feeding your newborn. °· Your intravenous (IV) tubing will be removed when you are drinking enough fluids. °· The  urine drainage tube (urinary catheter) that was inserted before delivery may be removed within 6 8 hours after delivery or when feeling returns to your legs. You should feel like you need to empty your bladder within the first 6 8 hours after the catheter has been removed. °· In case you become weak, lightheaded, or faint, call your nurse before you get out of bed for the first time and before you take a shower for the first time. °· Within the first few days after delivery, your breasts may begin to feel tender and full. This is called engorgement. Breast tenderness usually goes away within 48 72 hours after engorgement occurs. You may also notice milk leaking from your breasts. If you are not breastfeeding, do not stimulate your breasts. Breast stimulation can make your breasts produce more milk. °· Spending as much time as possible with your newborn is very important. During this time, you and your newborn can feel close and get to know each other. Having your newborn stay in your room (rooming in) will help to strengthen the bond with your newborn. It will give you time to get to know your newborn and become comfortable caring for your newborn. °· Your hormones change after delivery. Sometimes the hormone changes can temporarily cause you to feel sad or tearful. These feelings should not last more than a few days. If these feelings last longer   than that, you should talk to your caregiver. °· If desired, talk to your caregiver about methods of family planning or contraception. °· Talk to your caregiver about immunizations. Your caregiver may want you to have the following immunizations before leaving the hospital: °· Tetanus, diphtheria, and pertussis (Tdap) or tetanus and diphtheria (Td) immunization. It is very important that you and your family (including grandparents) or others caring for your newborn are up-to-date with the Tdap or Td immunizations. The Tdap or Td immunization can help protect your newborn  from getting ill. °· Rubella immunization. °· Varicella (chickenpox) immunization. °· Influenza immunization. You should receive this annual immunization if you did not receive the immunization during your pregnancy. °Document Released: 01/08/2012 Document Reviewed: 01/08/2012 °Exit[employer]® Patient Information ©2014 [employer]. ° °

## 2013-09-03 NOTE — Discharge Summary (Signed)
Attestation of Attending Supervision of Advanced Practitioner (CNM/NP): Evaluation and management procedures were performed by the Advanced Practitioner under my supervision and collaboration.  I have reviewed the Advanced Practitioner's note and chart, and I agree with the management and plan.  Keelon Zurn 09/03/2013 6:27 AM   

## 2013-09-08 ENCOUNTER — Encounter (HOSPITAL_COMMUNITY): Payer: Self-pay | Admitting: Obstetrics & Gynecology

## 2013-09-08 NOTE — Addendum Note (Signed)
Addendum created 09/08/13 1914 by Cristela BlueKyle Francisco Eyerly, MD   Modules edited: Anesthesia Events

## 2013-10-11 ENCOUNTER — Ambulatory Visit (INDEPENDENT_AMBULATORY_CARE_PROVIDER_SITE_OTHER): Payer: 59 | Admitting: Advanced Practice Midwife

## 2013-10-11 ENCOUNTER — Encounter: Payer: Self-pay | Admitting: Advanced Practice Midwife

## 2013-10-11 DIAGNOSIS — Z3009 Encounter for other general counseling and advice on contraception: Secondary | ICD-10-CM

## 2013-10-13 NOTE — Progress Notes (Signed)
  Subjective:     Pamela Howard is a 28 y.o. female who presents for a postpartum visit. She is 6 weeks postpartum following a low cervical transverse Cesarean section. I have fully reviewed the prenatal and intrapartum course. The delivery was at 38.4 gestational weeks. Outcome: primary cesarean section, low transverse incision for nonreassuring fetal heart tones. Anesthesia: epidural. Postpartum course has been normal. Baby's course has been normal. Baby is feeding by breast. Bleeding no bleeding. Bowel function is normal. Bladder function is normal. Patient is not sexually active. Contraception method is abstinence. Postpartum depression screening: negative.  The following portions of the patient's history were reviewed and updated as appropriate: allergies, current medications, past family history, past medical history, past social history, past surgical history and problem list.  Review of Systems Pertinent items are noted in HPI.   Last Pap 01/2013, normal. History of abnormal Pap and cryo approximately 3 years ago per patient.  Objective:    BP 120/83  Pulse 83  Resp 16  Ht 5\' 2"  (1.575 m)  Wt 101.152 kg (223 lb)  BMI 40.78 kg/m2  Breastfeeding? Yes  General:  alert, cooperative, appears stated age, no distress and moderately obese   Breasts:  inspection negative, no nipple discharge or bleeding, no masses or nodularity palpable  Lungs: clear to auscultation bilaterally  Heart:  regular rate and rhythm, S1, S2 normal, no murmur, click, rub or gallop  Abdomen: soft, non-tender; bowel sounds normal; no masses,  no organomegaly and Well-healed low transverse incision.    Vulva:  not evaluated  Vagina: not evaluated  Cervix:  Not evaluated  Corpus: not examined  Adnexa:  not evaluated  Rectal Exam: Not performed.        Assessment:     normal postpartum exam. Pap smear not done at today's visit.   Plan:   1. Contraception: Nexplanon 2. Abstinence until nexplanon  Insertion. 3. Follow up in: As soon as possible  or as needed.  4. Repeat Pap 10 2015. 5. Request records for abnormal Pap.

## 2013-10-14 ENCOUNTER — Ambulatory Visit (INDEPENDENT_AMBULATORY_CARE_PROVIDER_SITE_OTHER): Payer: 59 | Admitting: Obstetrics & Gynecology

## 2013-10-14 ENCOUNTER — Encounter: Payer: Self-pay | Admitting: Obstetrics & Gynecology

## 2013-10-14 VITALS — BP 122/80 | HR 82 | Resp 16 | Ht 62.0 in | Wt 223.0 lb

## 2013-10-14 DIAGNOSIS — Z30017 Encounter for initial prescription of implantable subdermal contraceptive: Secondary | ICD-10-CM

## 2013-10-14 DIAGNOSIS — Z01812 Encounter for preprocedural laboratory examination: Secondary | ICD-10-CM

## 2013-10-14 LAB — POCT URINE PREGNANCY: PREG TEST UR: NEGATIVE

## 2013-10-14 MED ORDER — ETONOGESTREL 68 MG ~~LOC~~ IMPL: 1.0000 | DRUG_IMPLANT | Freq: Once | SUBCUTANEOUS | Status: DC

## 2013-10-14 MED ORDER — ETONOGESTREL 68 MG ~~LOC~~ IMPL
68.0000 mg | DRUG_IMPLANT | Freq: Once | SUBCUTANEOUS | Status: AC
  Administered 2013-10-14: 68 mg via SUBCUTANEOUS

## 2013-10-14 NOTE — Progress Notes (Signed)
   Subjective:    Patient ID: Pamela Howard, female    DOB: 10-08-1985, 28 y.o.   MRN: [id number]  HPI  28 yo lady with a 57 week old son is here for Nexplanon insertion. She has not had sex since delivery. She had an Implanon for 3 years.  Review of Systems     Objective:   Physical Exam  UPT was negative. Consent was signed. Time out procedure was done. Her left arm was prepped with betadine and infiltrated with 3 cc of 1% lidocaine. After adequate anesthesia was assured, the Nexplanon device was placed according to standard of care. Her arm was hemostatic and was bandaged. She tolerated the procedure well.        Assessment & Plan:   Contraception- Nexplanon

## 2014-02-26 ENCOUNTER — Encounter (HOSPITAL_COMMUNITY): Payer: Self-pay | Admitting: Emergency Medicine

## 2014-02-26 ENCOUNTER — Emergency Department (HOSPITAL_COMMUNITY)
Admission: EM | Admit: 2014-02-26 | Discharge: 2014-02-26 | Disposition: A | Discharge status: Home / Self Care | Payer: 59 | Attending: Emergency Medicine | Admitting: Emergency Medicine

## 2014-02-26 DIAGNOSIS — L089 Local infection of the skin and subcutaneous tissue, unspecified: Secondary | ICD-10-CM | POA: Insufficient documentation

## 2014-02-26 DIAGNOSIS — M79674 Pain in right toe(s): Secondary | ICD-10-CM | POA: Diagnosis present

## 2014-02-26 DIAGNOSIS — Z8659 Personal history of other mental and behavioral disorders: Secondary | ICD-10-CM | POA: Diagnosis not present

## 2014-02-26 DIAGNOSIS — Z8619 Personal history of other infectious and parasitic diseases: Secondary | ICD-10-CM | POA: Diagnosis not present

## 2014-02-26 DIAGNOSIS — Z79899 Other long term (current) drug therapy: Secondary | ICD-10-CM | POA: Insufficient documentation

## 2014-02-26 DIAGNOSIS — E669 Obesity, unspecified: Secondary | ICD-10-CM | POA: Insufficient documentation

## 2014-02-26 DIAGNOSIS — G43909 Migraine, unspecified, not intractable, without status migrainosus: Secondary | ICD-10-CM | POA: Diagnosis not present

## 2014-02-26 MED ORDER — SULFAMETHOXAZOLE-TRIMETHOPRIM 800-160 MG PO TABS: 1.0000 | ORAL_TABLET | Freq: Two times a day (BID) | ORAL | Status: DC

## 2014-02-26 MED ORDER — OXYCODONE-ACETAMINOPHEN 5-325 MG PO TABS: 1.0000 | ORAL_TABLET | ORAL | Status: DC | PRN

## 2014-02-26 MED ORDER — LIDOCAINE HCL 2 % IJ SOLN
10.0000 mL | Freq: Once | INTRAMUSCULAR | Status: DC
  Filled 2014-02-26: qty 20

## 2014-02-26 NOTE — ED Notes (Signed)
Rt. Toe: x 4-5 days of swelling; and now their is green drainage.

## 2014-02-26 NOTE — ED Provider Notes (Signed)
Medical screening examination/treatment/procedure(s) were performed by non-physician practitioner and as supervising physician I was immediately available for consultation/collaboration.   EKG Interpretation None        Enid SkeensJoshua M Delfina Schreurs, MD 02/26/14 [phone]

## 2014-02-26 NOTE — ED Provider Notes (Signed)
CSN: [id number]     Arrival date & time 02/26/14  1124 History  This chart was scribed for non-physician practitioner, Sharilyn SitesLisa Rhyann Berton, PA-C working with Enid SkeensJoshua M Zavitz, MD, by Jarvis Morganaylor Ferguson, ED Scribe. This patient was seen in room TR07C/TR07C and the patient's care was started at 11:51 AM.    Chief Complaint  Patient presents with  . Toe Pain    The history is provided by the patient. No language interpreter was used.   HPI Comments: Pamela Howard is a 28 y.o. female with a h/o migraines, obesity, anxiety, genital herpes and gestational HTN who presents to the Emergency Department complaining of constant, "aching", "9/10",  right great toe pain for 4-5 days. Pt states she has had associated swelling of the toe and she now is having a greenish discharge. Pt thinks it may be an ingrown toenail which she has had before on her left foot. She notes that the pain is exacerbated by putting any kind of pressure on the toe. She recently had a pedicure about 2 weeks ago. Pt has been applying heat to the toe with mild relief. She denies any fever, chills, nausea, vomiting, or rash. She denies any h/o DM.  VS stable on arrival.  Past Medical History  Diagnosis Date  . Headache     migraines  . Herpes   . Obesity   . Anxiety   . Gestational hypertension   . Genital herpes    Past Surgical History  Procedure Laterality Date  . Wisdom tooth extraction    . Cesarean section N/A 08/31/2013    Procedure: CESAREAN SECTION;  Surgeon: Willodean Rosenthalarolyn Harraway-Smith, MD;  Location: [facility];  Service: Obstetrics;  Laterality: N/A;   Family History  Problem Relation Age of Onset  . Heart failure Father   . Hypertension Father   . Migraines Sister   . Migraines Paternal Grandmother    History  Substance Use Topics  . Smoking status: Never Smoker   . Smokeless tobacco: Not on file  . Alcohol Use: No   OB History   Grav Para Term Preterm Abortions TAB SAB Ect Mult Living   2 2 2       2      Review of  Systems  Constitutional: Negative for fever and chills.  Musculoskeletal: Positive for arthralgias (right great toe).  Skin: Positive for color change (and swelling along the toenail of right great toe). Negative for rash.       + greenish discharge from right great toenail  All other systems reviewed and are negative.     Allergies  Review of patient's allergies indicates no known allergies.  Home Medications   Prior to Admission medications   Medication Sig Start Date End Date Taking? Authorizing Provider  docusate sodium (COLACE) 100 MG capsule Take 1 capsule (100 mg total) by mouth every 12 (twelve) hours. 09/02/13   Minta BalsamMichael R Odom, MD  etonogestrel (NEXPLANON) 68 MG IMPL implant Inject 1 each (68 mg total) into the skin once. 10/14/13   Allie BossierMyra C Dove, MD  ferrous sulfate 325 (65 FE) MG tablet Take 325 mg by mouth. 09/05/13 09/05/14  Historical Provider, MD  ibuprofen (ADVIL,MOTRIN) 600 MG tablet Take 1 tablet (600 mg total) by mouth every 6 (six) hours. 09/02/13   Minta BalsamMichael R Odom, MD  Prenatal Vit-Fe Fum-FA-Omega (PRENATAL FORMULA) 28-0.8-235 MG CAPS Take 1 capsule by mouth daily. 06/23/13   Wilmer FloorLisa A Leftwich-Kirby, CNM  SUMAtriptan (IMITREX) 100 MG tablet Take 1 tablet (100 mg  total) by mouth every 2 (two) hours as needed for migraine or headache. May repeat in 2 hours if headache persists or recurs. 03/30/13   Delbert PhenixLinda M Barefoot, NP   Triage Vitals: BP 127/74  Pulse 72  Temp(Src) 97.9 F (36.6 C) (Oral)  Resp 16  SpO2 100%  LMP 01/22/2014  Breastfeeding? Yes  Physical Exam  Nursing note and vitals reviewed. Constitutional: She is oriented to person, place, and time. She appears well-developed and well-nourished. No distress.  HENT:  Head: Normocephalic and atraumatic.  Mouth/Throat: Oropharynx is clear and moist.  Eyes: Conjunctivae and EOM are normal. Pupils are equal, round, and reactive to light.  Neck: Normal range of motion. Neck supple.  Cardiovascular: Normal rate, regular  rhythm and normal heart sounds.   Pulmonary/Chest: Effort normal and breath sounds normal. No respiratory distress. She has no wheezes.  Musculoskeletal: Normal range of motion. She exhibits no edema.       Right foot: She exhibits tenderness and bony tenderness.       Feet:  Right great toe with swelling along lateral aspect of toenail; purulent drainage noted; no overlying erythema, induration, or streaking into foot/leg; nail remains intact without deformities; full ROM of toe maintained; foot remains NVI  Neurological: She is alert and oriented to person, place, and time.  Skin: Skin is warm and dry. She is not diaphoretic.  Psychiatric: She has a normal mood and affect.    ED Course  Procedures (including critical care time)  DIAGNOSTIC STUDIES: Oxygen Saturation is 100% on RA, normal  by my interpretation.    COORDINATION OF CARE:  Foot soaked, will start on abx and pain meds.  Will FU with podiatry  Labs Review Labs Reviewed - No data to display  Imaging Review No results found.   EKG Interpretation None      MDM   Final diagnoses:  Toe infection   28 y.o. F with right great toe infection.  Patient thinks she has ingrown toenail, hx of same on left foot.  On exam, right great toe with swelling along lateral aspect of toenail and associated purulent drainage.  Toenail remains intact without deformities.  Patient did recently have pedicure done at salon, concern for possible paronychia vs infected ingrown toenail.  Area is already open and freely draining, further I&D not indicated.  Do not feel this is indicative of osteomyelitis or septic digit.  Patient is not diabetic, is non-toxic appearing, and VS stable.  Patient will be started on abx and referred to podiatry for further management.  Encouraged to continue warm soaks at home to help aid drainage.  Discussed plan with patient, he/she acknowledged understanding and agreed with plan of care.  Return precautions given for  new or worsening symptoms.  I personally performed the services described in this documentation, which was scribed in my presence. The recorded information has been reviewed and is accurate.  Garlon HatchetLisa M Alif Petrak, PA-C 02/26/14 1239

## 2014-02-26 NOTE — Discharge Instructions (Signed)
Take the prescribed medication as directed.  Continue warm soaks at home to help with drainage. Follow-up with podiatrist. Return to the ED for new or worsening symptoms.

## 2014-02-28 ENCOUNTER — Encounter (HOSPITAL_COMMUNITY): Payer: Self-pay | Admitting: Emergency Medicine

## 2014-03-02 ENCOUNTER — Ambulatory Visit (INDEPENDENT_AMBULATORY_CARE_PROVIDER_SITE_OTHER): Payer: 59 | Admitting: Podiatry

## 2014-03-02 ENCOUNTER — Encounter: Payer: Self-pay | Admitting: Podiatry

## 2014-03-02 VITALS — BP 119/88 | HR 88 | Ht 62.0 in | Wt 212.0 lb

## 2014-03-02 DIAGNOSIS — L600 Ingrowing nail: Secondary | ICD-10-CM

## 2014-03-02 DIAGNOSIS — L03031 Cellulitis of right toe: Secondary | ICD-10-CM | POA: Insufficient documentation

## 2014-03-02 NOTE — Progress Notes (Signed)
Subjective:  28 year old female presents complaining of painful ingrown nail on right great toe and requests for corrective procedure. She has had the similar incident on left great toe and was resolved with ingrown nail surgery. She is on her feet with 12 hour shift 3 days a week.  She was seen at ER prior to this visit.  Objective: Dermatologic: Dry proud flesh at lateral ungual labia right great toe with pain. No active drainage or cellulitis noted. All other nails are normal and painted over.  Vascular: All pedal pulses are palpable. Neurologic: All epicritic and tactile sensations grossly intact. Orthopedic: Rectus foot without gross deformity.  Assessment: Painful ingrown nail right hallux lateral border with inflammation.   Plan: Reviewed findings and available options. Ingrown nails surgery done on right great toe lateral border with Phenol and Alcohol. Local used with total 4ml of 50/50 mixture 0.5% Marcaine plain and 1% Xylocaine with epinephrine.  Patient tolerated procedure well. Home care instruction with wound care supply dispensed.  Return in one week.

## 2014-03-02 NOTE — Patient Instructions (Signed)
Ingrown nail surgery done on right great toe. Follow soaking instruction. Return in one week.

## 2014-03-09 ENCOUNTER — Encounter: Payer: 59 | Admitting: Podiatry

## 2014-03-11 ENCOUNTER — Encounter: Payer: 59 | Admitting: Podiatry

## 2014-03-14 ENCOUNTER — Encounter: Payer: 59 | Admitting: Podiatry

## 2014-03-31 ENCOUNTER — Other Ambulatory Visit: Payer: Self-pay | Admitting: *Deleted

## 2014-03-31 DIAGNOSIS — G43819 Other migraine, intractable, without status migrainosus: Secondary | ICD-10-CM

## 2014-03-31 MED ORDER — PROMETHAZINE HCL 25 MG PO TABS: 25.0000 mg | ORAL_TABLET | Freq: Four times a day (QID) | ORAL | Status: DC | PRN

## 2014-03-31 NOTE — Telephone Encounter (Signed)
RF authorization sent to [facility] for Phenergan refill for migraines.

## 2014-06-15 ENCOUNTER — Ambulatory Visit: Payer: 59 | Admitting: Obstetrics & Gynecology

## 2014-06-23 ENCOUNTER — Ambulatory Visit: Payer: 59 | Admitting: Obstetrics & Gynecology

## 2014-06-23 DIAGNOSIS — Z113 Encounter for screening for infections with a predominantly sexual mode of transmission: Secondary | ICD-10-CM

## 2015-05-09 ENCOUNTER — Ambulatory Visit: Payer: 59 | Admitting: Obstetrics & Gynecology

## 2015-05-17 ENCOUNTER — Encounter: Payer: Self-pay | Admitting: Obstetrics & Gynecology

## 2015-05-17 ENCOUNTER — Ambulatory Visit (INDEPENDENT_AMBULATORY_CARE_PROVIDER_SITE_OTHER): Payer: Managed Care, Other (non HMO) | Admitting: Obstetrics & Gynecology

## 2015-05-17 VITALS — BP 122/83 | HR 87 | Resp 16 | Ht 62.0 in | Wt 237.0 lb

## 2015-05-17 DIAGNOSIS — Z124 Encounter for screening for malignant neoplasm of cervix: Secondary | ICD-10-CM | POA: Diagnosis not present

## 2015-05-17 DIAGNOSIS — Z0000 Encounter for general adult medical examination without abnormal findings: Secondary | ICD-10-CM

## 2015-05-17 DIAGNOSIS — Z01419 Encounter for gynecological examination (general) (routine) without abnormal findings: Secondary | ICD-10-CM | POA: Diagnosis not present

## 2015-05-17 DIAGNOSIS — Z113 Encounter for screening for infections with a predominantly sexual mode of transmission: Secondary | ICD-10-CM

## 2015-05-17 LAB — COMPREHENSIVE METABOLIC PANEL
ALBUMIN: 4.2 g/dL (ref 3.6–5.1)
ALK PHOS: 71 U/L (ref 33–115)
ALT: 18 U/L (ref 6–29)
AST: 19 U/L (ref 10–30)
BILIRUBIN TOTAL: 0.7 mg/dL (ref 0.2–1.2)
BUN: 9 mg/dL (ref 7–25)
CALCIUM: 9.1 mg/dL (ref 8.6–10.2)
CO2: 20 mmol/L (ref 20–31)
Chloride: 107 mmol/L (ref 98–110)
Creat: 0.69 mg/dL (ref 0.50–1.10)
GLUCOSE: 83 mg/dL (ref 65–99)
Potassium: 3.8 mmol/L (ref 3.5–5.3)
Sodium: 137 mmol/L (ref 135–146)
Total Protein: 6.9 g/dL (ref 6.1–8.1)

## 2015-05-17 NOTE — Progress Notes (Signed)
Subjective:    Pamela Howard is a 30 y.o. S AA P2 (44 yo daughter and 2 yo son) female who presents for an annual exam. The patient has no complaints today. She wants STI testing.  The patient is sexually active. GYN screening history: last pap: was normal. The patient wears seatbelts: yes. The patient participates in regular exercise: yes. ( gym work ouit 3 times per week and has lost 8 # in 2 weeks) Has the patient ever been transfused or tattooed?: yes. (tattoos) The patient reports that there is not domestic violence in her life.   Menstrual History: OB History    Gravida Para Term Preterm AB TAB SAB Ectopic Multiple Living   Menarche age: 72  Patient's last menstrual period was 04/05/2015 (approximate).    The following portions of the patient's history were reviewed and updated as appropriate: allergies, current medications, past family history, past medical history, past social history, past surgical history and problem list.  Review of Systems Pertinent items are noted in HPI.  She had a flu vaccine this season. 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 Monogamous for more than a year. She lives with her kids. [facility].   Objective:    BP 122/83 mmHg  Pulse 87  Resp 16  Ht  (1.575 m)  Wt 237 lb (107.502 kg)  BMI 43.34 kg/m2  LMP 04/05/2015 (Approximate)  Breastfeeding? No  General Appearance:    Alert, cooperative, no distress, appears stated age  Head:    Normocephalic, without obvious abnormality, atraumatic  Eyes:    PERRL, conjunctiva/corneas clear, EOM's intact, fundi    benign, both eyes  Ears:    Normal TM's and external ear canals, both ears  Nose:   Nares normal, septum midline, mucosa normal, no drainage    or sinus tenderness  Throat:   Lips, mucosa, and tongue normal; teeth and gums normal  Neck:   Supple, symmetrical, trachea midline, no adenopathy;    thyroid:   no enlargement/tenderness/nodules; no carotid   bruit or JVD  Back:     Symmetric, no curvature, ROM normal, no CVA tenderness  Lungs:     Clear to auscultation bilaterally, respirations unlabored  Chest Wall:    No tenderness or deformity   Heart:    Regular rate and rhythm, S1 and S2 normal, no murmur, rub   or gallop  Breast Exam:    No tenderness, masses, or nipple abnormality  Abdomen:     Soft, non-tender, bowel sounds active all four quadrants,    no masses, no organomegaly  Genitalia:    Normal female without lesion, discharge or tenderness     Extremities:   Extremities normal, atraumatic, no cyanosis or edema  Pulses:   2+ and symmetric all extremities  Skin:   Skin color, texture, turgor normal, no rashes or lesions  Lymph nodes:   Cervical, supraclavicular, and axillary nodes normal  Neurologic:   CNII-XII intact, normal strength, sensation and reflexes    throughout  .    Assessment:    Healthy female exam.    Plan:     Breast self exam technique reviewed and patient encouraged to perform self-exam monthly. Chlamydia specimen. GC specimen. Thin prep Pap smear. STI testing per patient

## 2015-05-18 ENCOUNTER — Telehealth: Payer: Self-pay | Admitting: *Deleted

## 2015-05-18 LAB — CBC
HEMATOCRIT: 44.1 % (ref 36.0–46.0)
HEMOGLOBIN: 14.4 g/dL (ref 12.0–15.0)
MCH: 30.3 pg (ref 26.0–34.0)
MCHC: 32.7 g/dL (ref 30.0–36.0)
MCV: 92.6 fL (ref 78.0–100.0)
MPV: 9.9 fL (ref 8.6–12.4)
Platelets: 323 10*3/uL (ref 150–400)
RBC: 4.76 MIL/uL (ref 3.87–5.11)
RDW: 13.2 % (ref 11.5–15.5)
WBC: 8.2 10*3/uL (ref 4.0–10.5)

## 2015-05-18 LAB — HEPATITIS B SURFACE ANTIGEN: Hepatitis B Surface Ag: NEGATIVE

## 2015-05-18 LAB — RPR

## 2015-05-18 LAB — HIV ANTIBODY (ROUTINE TESTING W REFLEX): HIV 1&2 Ab, 4th Generation: NONREACTIVE

## 2015-05-18 LAB — HEPATITIS C ANTIBODY: HCV Ab: NEGATIVE

## 2015-05-18 NOTE — Telephone Encounter (Signed)
Pt notified of normal lab work. 

## 2015-05-19 LAB — CYTOLOGY - PAP

## 2015-06-06 ENCOUNTER — Encounter: Payer: Self-pay | Admitting: Physician Assistant

## 2015-06-06 ENCOUNTER — Ambulatory Visit (INDEPENDENT_AMBULATORY_CARE_PROVIDER_SITE_OTHER): Payer: Managed Care, Other (non HMO) | Admitting: Physician Assistant

## 2015-06-06 VITALS — BP 129/75 | HR 104 | Resp 16 | Ht 62.0 in | Wt 231.0 lb

## 2015-06-06 DIAGNOSIS — M62838 Other muscle spasm: Secondary | ICD-10-CM

## 2015-06-06 DIAGNOSIS — G4440 Drug-induced headache, not elsewhere classified, not intractable: Secondary | ICD-10-CM

## 2015-06-06 DIAGNOSIS — G43009 Migraine without aura, not intractable, without status migrainosus: Secondary | ICD-10-CM | POA: Diagnosis not present

## 2015-06-06 MED ORDER — NAPROXEN 500 MG PO TABS: 500.0000 mg | ORAL_TABLET | Freq: Two times a day (BID) | ORAL | Status: AC

## 2015-06-06 MED ORDER — RIZATRIPTAN BENZOATE 10 MG PO TBDP: 10.0000 mg | ORAL_TABLET | ORAL | Status: AC | PRN

## 2015-06-06 MED ORDER — CYCLOBENZAPRINE HCL 10 MG PO TABS: 10.0000 mg | ORAL_TABLET | Freq: Three times a day (TID) | ORAL | Status: AC | PRN

## 2015-06-06 MED ORDER — TOPIRAMATE 25 MG PO TABS: 75.0000 mg | ORAL_TABLET | Freq: Every day | ORAL | Status: AC

## 2015-06-06 NOTE — Progress Notes (Signed)
Patient ID: Pamela Howard, female   DOB: 12/26/1985, 29 y.o.   MRN: [id number] History:  Pamela Howard is a 30 y.o. [id number] who presents to clinic today for headache evaluation.   The HA starts mild but worsens to be severe. It lasts all day. There is throbbing, located mid frontal at first and then bilat retroorbital. Movement makes it worse.   There is photophobia and phonophobia.  There is nausea but no vomiting. She gets blurry vision with the HA.  Temperature changes bring on the HA.    No neurological symptoms prior to HA.   She takes Phentermine + Topamax for weight loss.   No h/o stroke or blood clots.  Father died of heart attack related to recreational drug use.    She has one year old son.  He was delivered by emergency c-section after failed induction due to hypertension.  Pt got fever and baby had elevated heart rate.  After delivery, BP  improved prior to discharge.    Prior medications include Imitrex (caused drowsiness) and flexeril.  She also takes excedrin - presently 4-6/day.    Number of days in the last 4 weeks with:  Severe headache: 26 Moderate headache: 0 Mild headache: 0 No headache: 2   Past Medical History  Diagnosis Date  . Headache     migraines  . Herpes   . Obesity   . Anxiety   . Gestational hypertension   . Genital herpes   . Abnormal Pap smear of cervix     Social History   Social History  . Marital Status: Single    Spouse Name: N/A  . Number of Children: N/A  . Years of Education: N/A   Occupational History  . Not on file.   Social History Main Topics  . Smoking status: Current Every Day Smoker    Types: Cigarettes  . Smokeless tobacco: Never Used  . Alcohol Use: No  . Drug Use: No  . Sexual Activity:    Partners: Male   Other Topics Concern  . Not on file   Social History Narrative    Family History  Problem Relation Age of Onset  . Heart failure Father   . Hypertension Father   . Migraines Sister   . Migraines  Paternal Grandmother     No Known Allergies  Current Outpatient Prescriptions on File Prior to Visit  Medication Sig Dispense Refill  . lidocaine (LIDODERM) 5 % as needed.    . Phentermine-Topiramate (QSYMIA) 3.75-23 MG CP24 Take 1 capsule by mouth.     No current facility-administered medications on file prior to visit.     Review of Systems:  All pertinent positive/negative included in HPI, all other review of systems are negative  Objective:  Physical Exam BP 129/75 mmHg  Pulse 104  Resp 16  Ht  (1.575 m)  Wt 231 lb (104.781 kg)  BMI 42.24 kg/m2  LMP 04/05/2015 (Approximate) CONSTITUTIONAL: Well-developed, well-nourished female in no acute distress.  EYES: EOM intact ENT: Normocephalic CARDIOVASCULAR: Regular rate and rhythm with no adventitious sounds.  RESPIRATORY: Normal rate. Clear to auscultation bilaterally.  ENDOCRINE: Normal thyroid.  MUSCULOSKELETAL: Normal ROM, strength equal bilaterally, significant bilat muscle spasm noted in neck and shoulders SKIN: Warm, dry without erythema  NEUROLOGICAL: Alert, oriented, CN II-XII grossly intact, Appropriate balance, No dysmetria, Sensation equal bilaterally, Romberg negative.   PSYCH: Normal behavior, mood   Assessment & Plan:  Assessment: Migraine without aura Rebound HA Muscle spasm  Plan: Prednisone taper to help stop the Excedrin.   day 1,  day 2,  day 3 and  day 4. Maxalt MLT 1 po prn migraine.  May repeat in 2 hours.  Max of 2/day.  Do not use more than 2 days/week Naprosyn  - prn HA.  Limit to 2 days/wee Flexeril - prn HA or muscle spasm - up to TID - sedation precautions Topamax  - use qd - increase to  after 1 week and then to  after another week as tolerated.   Continue exercise - currently does cardio 3 times a week at [employer]   Has had good esults from [facility] if needed   Follow-up in 3 months or sooner PRN  Bertram Denver, PA-C 06/06/2015 9:53  AM

## 2015-06-06 NOTE — Patient Instructions (Signed)

## 2015-08-05 IMAGING — US US OB COMP +14 WK
1 series · 12 of 28 positions shown · non-contrast
Comparison: none

[Series 1: us ob comp +14 wk · 12 of 75 slices shown]
[im 3/75]
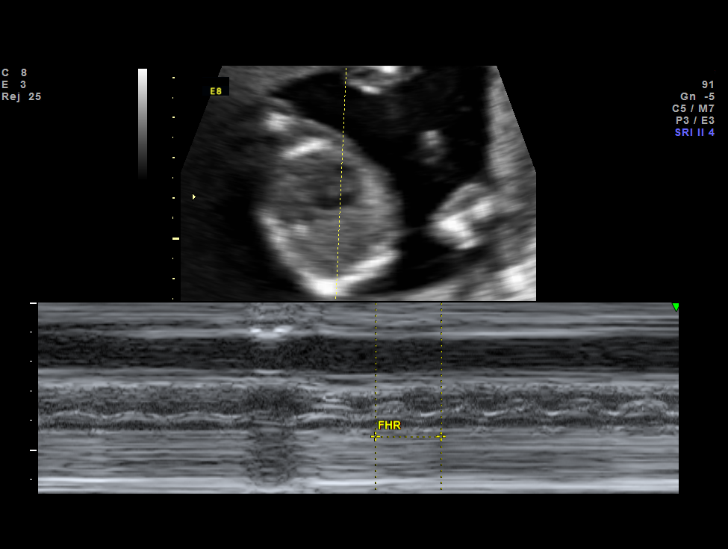
[im 9/75]
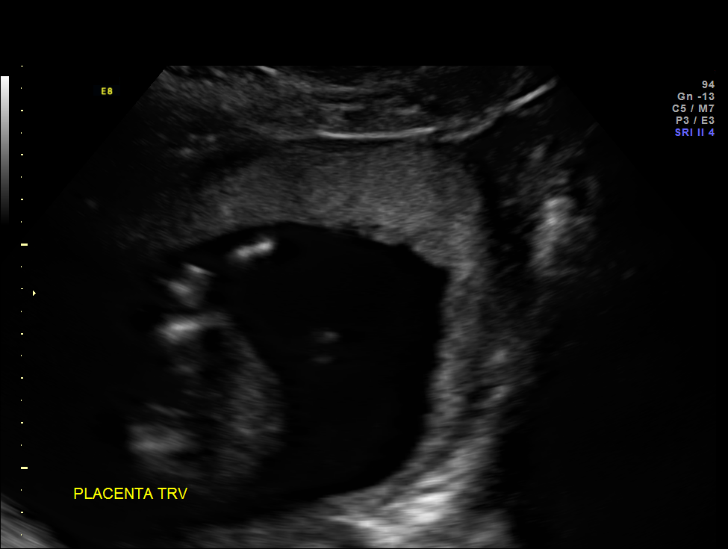
[im 14/75]
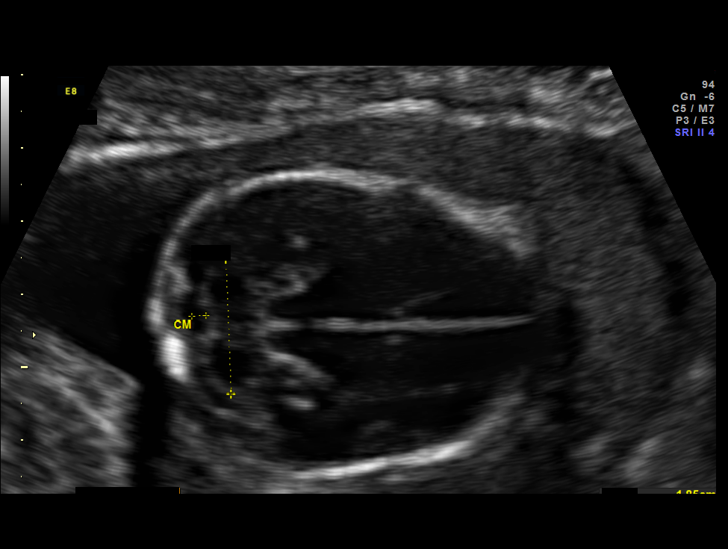
[im 22/75]
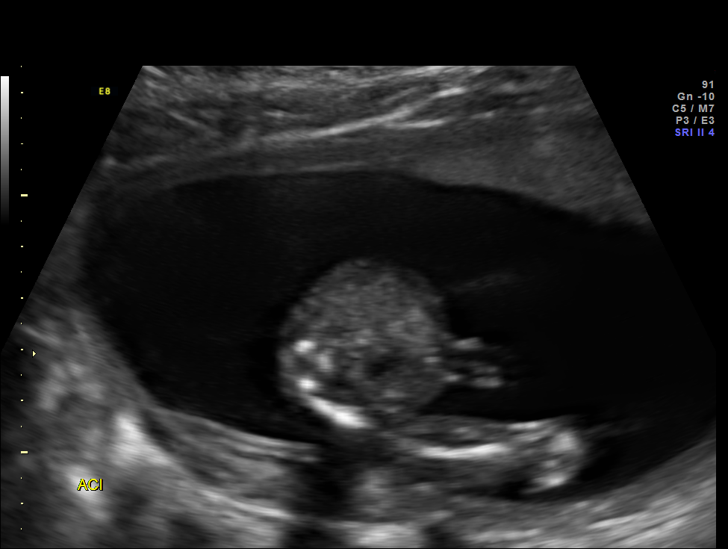
[im 28/75]
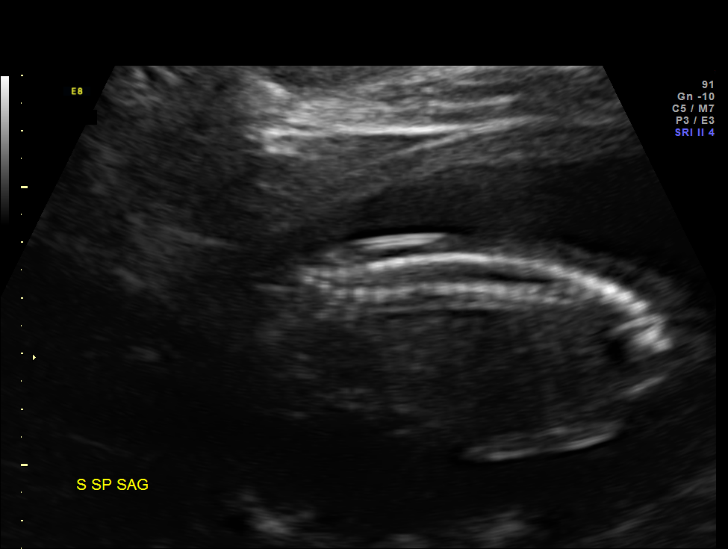
[im 33/75]
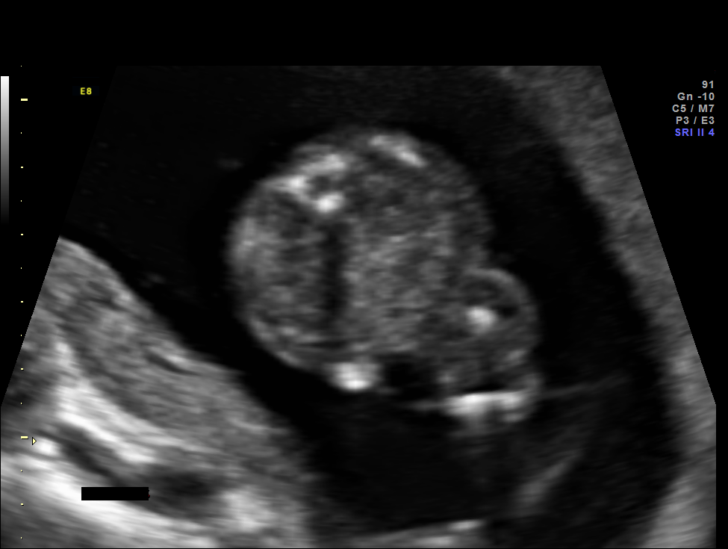
[im 42/75]
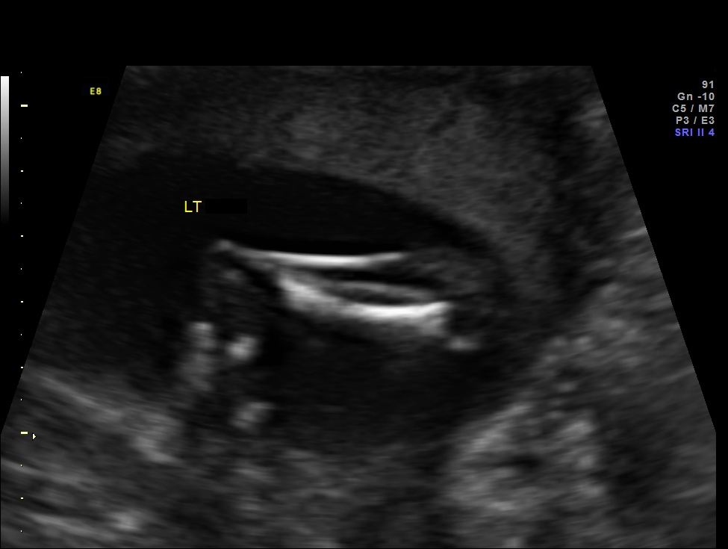
[im 47/75]
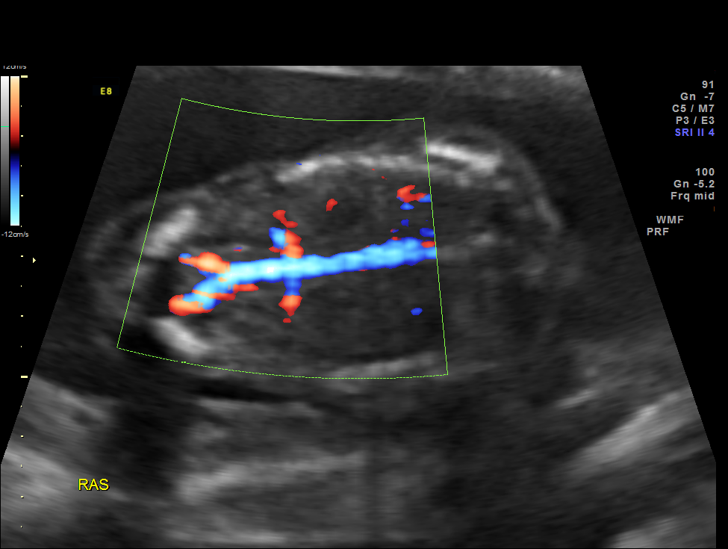
[im 53/75]
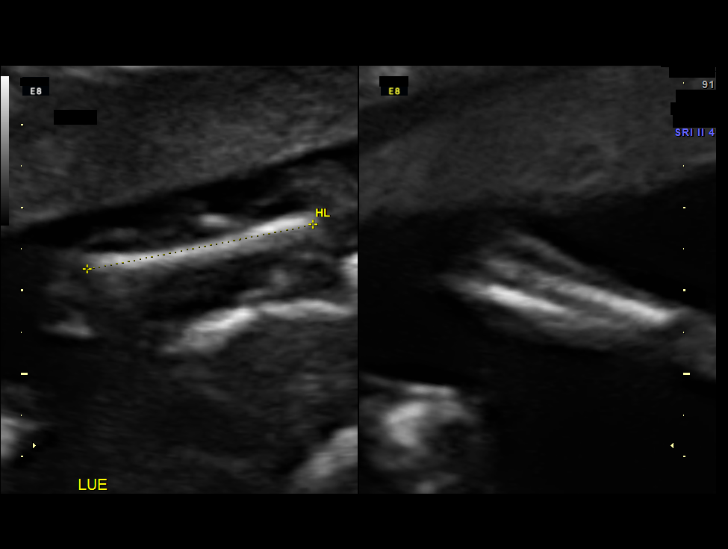
[im 61/75]
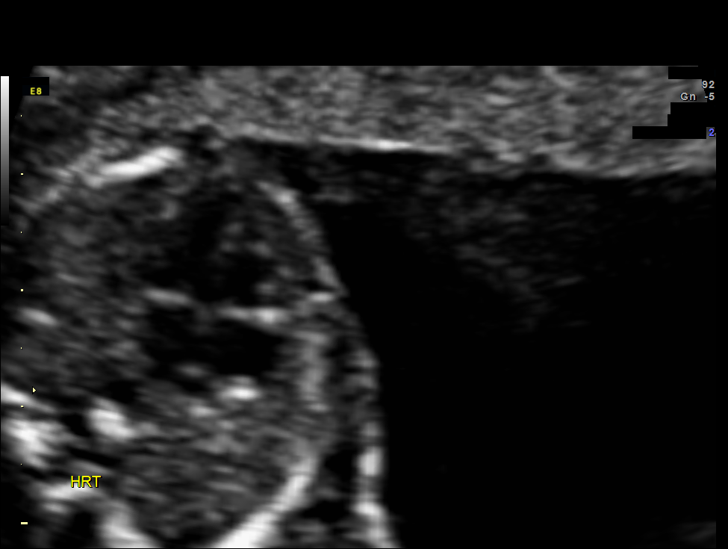
[im 66/75]
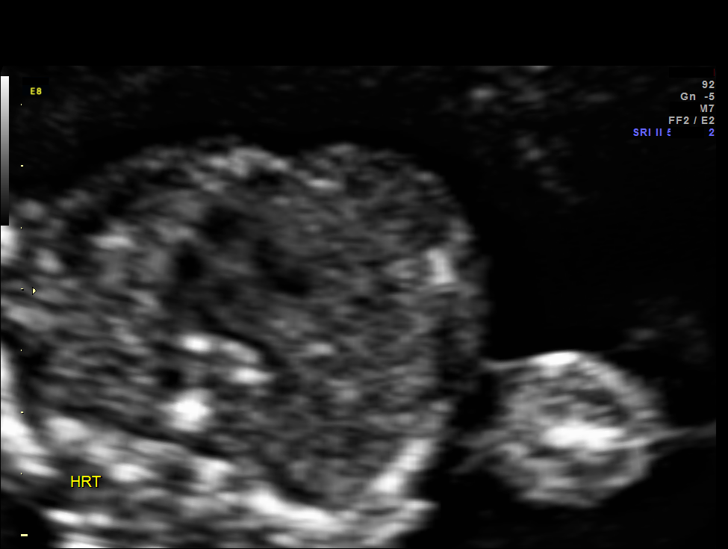
[im 72/75]
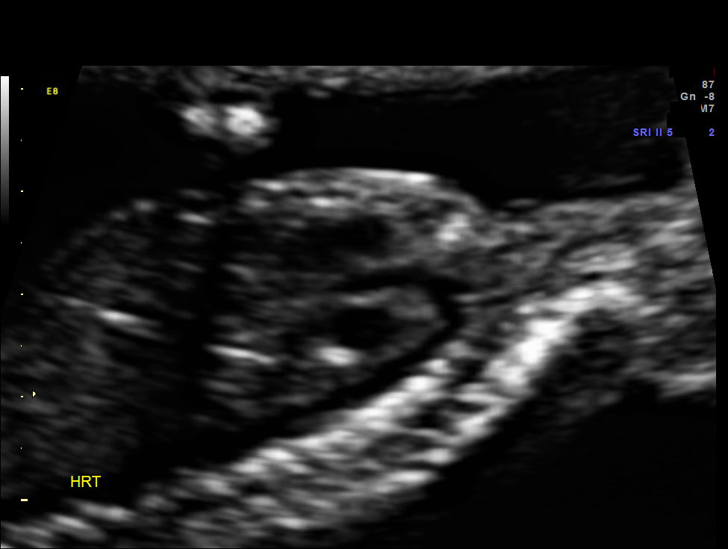

[12 of 28 positions shown; findings below may reference images not displayed]

OBSTETRICS REPORT
                      (Signed Final 04/12/2013 [DATE])

Service(s) Provided

 US OB COMP + 14 WK                                    76805.1
Indications

 Basic anatomic survey
Fetal Evaluation

 Num Of Fetuses:    1
 Fetal Heart Rate:  153                          bpm
 Cardiac Activity:  Observed
 Presentation:      Cephalic
 Placenta:          Anterior left, above
                    cervical os
 P. Cord            Visualized, central
 Insertion:

 Amniotic Fluid
 AFI FV:      Subjectively within normal limits
                                             Larg Pckt:     5.7  cm
Biometry

 BPD:     41.9  mm     G. Age:  18w 5d                CI:         77.0   70 - 86
 OFD:     54.4  mm                                    FL/HC:      19.6   15.8 -
                                                                         18
 HC:     155.2  mm     G. Age:  18w 3d       65  %    HC/AC:      1.23   1.07 -

 AC:     126.2  mm     G. Age:  18w 2d       55  %    FL/BPD:
 FL:      30.4  mm     G. Age:  19w 3d       88  %    FL/AC:      24.1   20 - 24
 HUM:     27.9  mm     G. Age:  19w 0d       83  %
 CER:     18.5  mm     G. Age:  18w 2d       58  %
 NFT:      2.5  mm

 Est. FW:     255  gm      0 lb 9 oz     61  %
Gestational Age

 LMP:           18w 0d        Date:  12/07/12                 EDD:   09/13/13
 U/S Today:     18w 5d                                        EDD:   09/08/13
 Best:          18w 0d     Det. By:  LMP  (12/07/12)          EDD:   09/13/13
Anatomy
 Cranium:          Appears normal         Aortic Arch:      Appears normal
 Fetal Cavum:      Appears normal         Ductal Arch:      Appears normal
 Ventricles:       Appears normal         Diaphragm:        Appears normal
 Choroid Plexus:   Appears normal         Stomach:          Appears normal
 Cerebellum:       Appears normal         Abdomen:          Appears normal
 Posterior Fossa:  Appears normal         Abdominal Wall:   Appears nml (cord
                                                            insert, abd wall)
 Nuchal Fold:      Appears normal         Cord Vessels:     Appears normal (3
                                                            vessel cord)
 Face:             Appears normal         Kidneys:          Appear normal
                   (orbits and profile)
 Lips:             Appears normal         Bladder:          Appears normal
 Heart:            Appears normal         Spine:            Appears normal
                   (4CH, axis, and
                   situs)
 RVOT:             Appears normal         Lower             Appears normal
                                          Extremities:
 LVOT:             Appears normal         Upper             Appears normal
                                          Extremities:

 Other:  Fetus appears to be a male. Heels and 5th digit visualized. Nasal
         bone visualized.
Targeted Anatomy

 Fetal Central Nervous System
 Cisterna Magna:
Cervix Uterus Adnexa

 Cervical Length:    4.1      cm

 Cervix:       Normal appearance by transabdominal scan.
 Left Ovary:    Within normal limits.
 Right Ovary:   Within normal limits.

 Adnexa:     No abnormality visualized.
Impression

 IUP at 18+0 weeks
 Normal detailed fetal anatomy
 Markers of aneuploidy: none
 Normal amniotic fluid volume
 Measurements consistent with LMP dating
Recommendations

 Follow-up as clinically indicated

 questions or concerns.

## 2015-09-07 ENCOUNTER — Ambulatory Visit (INDEPENDENT_AMBULATORY_CARE_PROVIDER_SITE_OTHER): Payer: Managed Care, Other (non HMO) | Admitting: Obstetrics & Gynecology

## 2015-09-07 ENCOUNTER — Encounter: Payer: Self-pay | Admitting: Obstetrics & Gynecology

## 2015-09-07 ENCOUNTER — Ambulatory Visit: Payer: Managed Care, Other (non HMO) | Admitting: Obstetrics & Gynecology

## 2015-09-07 VITALS — BP 123/73 | HR 106 | Resp 16 | Ht 62.0 in | Wt 238.0 lb

## 2015-09-07 DIAGNOSIS — Z3046 Encounter for surveillance of implantable subdermal contraceptive: Secondary | ICD-10-CM

## 2015-09-07 DIAGNOSIS — Z113 Encounter for screening for infections with a predominantly sexual mode of transmission: Secondary | ICD-10-CM

## 2015-09-07 DIAGNOSIS — Z202 Contact with and (suspected) exposure to infections with a predominantly sexual mode of transmission: Secondary | ICD-10-CM

## 2015-09-07 NOTE — Progress Notes (Signed)
   Subjective:    Patient ID: Pamela Howard, female    DOB: 05-28-1985, 28 y.o.   MRN: [id number]  HPI 30 yo S AA P2 is here for STI testing. She has had a new partner since her last testing here 1/17. She is having no symptoms or problems.  She is very happy with her Nexplanon. She only has some bleeding about every 3 months.   Review of Systems     Objective:   Physical Exam [facility] Breathing, conversing, and ambulating normally Abd- benign       Assessment & Plan:  Desire for STI testing- labs obtained

## 2015-09-08 LAB — RPR

## 2015-09-08 LAB — HEPATITIS B SURFACE ANTIGEN: Hepatitis B Surface Ag: NEGATIVE

## 2015-09-08 LAB — HEPATITIS C ANTIBODY: HCV Ab: NEGATIVE

## 2015-09-08 LAB — HIV ANTIBODY (ROUTINE TESTING W REFLEX): HIV 1&2 Ab, 4th Generation: NONREACTIVE

## 2015-09-11 LAB — URINE CYTOLOGY ANCILLARY ONLY
CHLAMYDIA, DNA PROBE: NEGATIVE
NEISSERIA GONORRHEA: POSITIVE — AB
TRICH (WINDOWPATH): POSITIVE — AB

## 2015-09-12 ENCOUNTER — Telehealth: Payer: Self-pay

## 2015-09-12 MED ORDER — METRONIDAZOLE 500 MG PO TABS: 500.0000 mg | ORAL_TABLET | Freq: Two times a day (BID) | ORAL | Status: AC

## 2015-09-12 NOTE — Telephone Encounter (Signed)
Patient positive for trichomonas and gonorrhea. Patient need Flagyl  Prescription and Rocephin 250mg  IM per Dr. Marice PotterVE.  Left message for patient to return call to office. Armandina StammerJennifer Jenney Brester RN BSN

## 2015-09-12 NOTE — Telephone Encounter (Signed)
Patient return call to office and made aware of positive results. Patient made aware that I have sent in the Flagyl to take by mouth for seven days and made aware to avoid alcohol while taking medication. Patient also made aware she will need IM injection of Rocephin for the gonorrhea. Patient made aware to not have sexual relations with partner until partner is tested and treated. Patient states understanding. Pamela StammerJennifer Howard RN BSN

## 2015-09-13 ENCOUNTER — Ambulatory Visit (INDEPENDENT_AMBULATORY_CARE_PROVIDER_SITE_OTHER): Payer: Managed Care, Other (non HMO)

## 2015-09-13 DIAGNOSIS — A5402 Gonococcal vulvovaginitis, unspecified: Secondary | ICD-10-CM

## 2015-09-13 DIAGNOSIS — A549 Gonococcal infection, unspecified: Secondary | ICD-10-CM

## 2015-09-13 MED ORDER — CEFTRIAXONE SODIUM 1 G IJ SOLR
250.0000 mg | INTRAMUSCULAR | Status: AC
Start: 2015-09-13 — End: ?
  Administered 2015-09-13: 250 mg via INTRAMUSCULAR

## 2015-11-21 ENCOUNTER — Other Ambulatory Visit (INDEPENDENT_AMBULATORY_CARE_PROVIDER_SITE_OTHER): Payer: Managed Care, Other (non HMO)

## 2015-11-21 ENCOUNTER — Encounter (INDEPENDENT_AMBULATORY_CARE_PROVIDER_SITE_OTHER): Payer: Self-pay

## 2015-11-21 VITALS — BP 127/88 | HR 89

## 2015-11-21 DIAGNOSIS — Z113 Encounter for screening for infections with a predominantly sexual mode of transmission: Secondary | ICD-10-CM

## 2015-11-21 DIAGNOSIS — Z202 Contact with and (suspected) exposure to infections with a predominantly sexual mode of transmission: Secondary | ICD-10-CM | POA: Diagnosis not present

## 2015-11-22 LAB — URINE CYTOLOGY ANCILLARY ONLY
Chlamydia: NEGATIVE
Neisseria Gonorrhea: NEGATIVE
TRICH (WINDOWPATH): NEGATIVE

## 2016-09-11 ENCOUNTER — Encounter: Payer: Self-pay | Admitting: Obstetrics & Gynecology

## 2016-09-11 ENCOUNTER — Ambulatory Visit (INDEPENDENT_AMBULATORY_CARE_PROVIDER_SITE_OTHER): Payer: Self-pay | Admitting: Obstetrics & Gynecology

## 2016-09-11 VITALS — BP 119/79 | HR 88 | Ht 62.0 in | Wt 237.0 lb

## 2016-09-11 DIAGNOSIS — Z3046 Encounter for surveillance of implantable subdermal contraceptive: Secondary | ICD-10-CM

## 2016-09-11 DIAGNOSIS — Z01812 Encounter for preprocedural laboratory examination: Secondary | ICD-10-CM

## 2016-09-11 DIAGNOSIS — Z3202 Encounter for pregnancy test, result negative: Secondary | ICD-10-CM

## 2016-09-11 DIAGNOSIS — Z309 Encounter for contraceptive management, unspecified: Secondary | ICD-10-CM

## 2016-09-11 DIAGNOSIS — Z113 Encounter for screening for infections with a predominantly sexual mode of transmission: Secondary | ICD-10-CM

## 2016-09-11 DIAGNOSIS — Z30017 Encounter for initial prescription of implantable subdermal contraceptive: Secondary | ICD-10-CM

## 2016-09-11 LAB — POCT URINE PREGNANCY: Preg Test, Ur: NEGATIVE

## 2016-09-11 MED ORDER — ETONOGESTREL 68 MG ~~LOC~~ IMPL
68.0000 mg | DRUG_IMPLANT | Freq: Once | SUBCUTANEOUS | Status: AC
  Administered 2016-09-11: 68 mg via SUBCUTANEOUS

## 2016-09-11 NOTE — Progress Notes (Signed)
   Subjective:    Patient ID: Pamela Howard, female    DOB: [birth year], 31 y.o.   MRN: [id number]  HPI 31 yo S AA P2 here today to have her newly expiring Nexplanon replaced with a new one. She has a period about every 3 month, happy with this method.   Review of Systems Pap negative last year    Objective:   [facility] signed and time out was done. Her left arm was prepped with betadine after establishing the position of the Nexplanon. The area was infiltrated with 2 cc of 1% lidocaine. A small incision was made and the intact rod was easily removed and noted to be intact. I then used the same incision to place a new Nexplanon according to standard of care.  A steristrip was placed and her arm was noted to be hemostatic. It was bandaged.  She tolerated the procedure well.     Assessment & Plan:  Contraception- Nexplanon Preventative health care- STI check per patient request

## 2016-09-12 LAB — GC/CHLAMYDIA PROBE AMP (~~LOC~~) NOT AT ARMC
CHLAMYDIA, DNA PROBE: NEGATIVE
NEISSERIA GONORRHEA: NEGATIVE

## 2016-09-13 LAB — HIV ANTIBODY (ROUTINE TESTING W REFLEX): HIV 1&2 Ab, 4th Generation: NONREACTIVE

## 2016-09-13 LAB — HEPATITIS B SURFACE ANTIGEN: Hepatitis B Surface Ag: NEGATIVE

## 2016-09-13 LAB — RPR

## 2016-09-13 LAB — HEPATITIS C ANTIBODY: HCV AB: NEGATIVE

## 2017-05-15 ENCOUNTER — Ambulatory Visit: Payer: Managed Care, Other (non HMO) | Admitting: Obstetrics and Gynecology

## 2018-10-02 ENCOUNTER — Telehealth: Payer: Self-pay | Admitting: *Deleted

## 2018-10-02 NOTE — Telephone Encounter (Signed)
Left patient a message that we do take Pamela Howard insurance.

## 2020-08-14 ENCOUNTER — Other Ambulatory Visit: Payer: Self-pay

## 2020-08-14 ENCOUNTER — Emergency Department (HOSPITAL_COMMUNITY)
Admission: EM | Admit: 2020-08-14 | Discharge: 2020-08-14 | Discharge status: Against Medical Advice | Payer: 59 | Attending: Emergency Medicine | Admitting: Emergency Medicine

## 2020-08-14 ENCOUNTER — Encounter (HOSPITAL_COMMUNITY): Payer: Self-pay | Admitting: *Deleted

## 2020-08-14 DIAGNOSIS — Z87891 Personal history of nicotine dependence: Secondary | ICD-10-CM | POA: Insufficient documentation

## 2020-08-14 DIAGNOSIS — T782XXA Anaphylactic shock, unspecified, initial encounter: Secondary | ICD-10-CM | POA: Diagnosis not present

## 2020-08-14 DIAGNOSIS — R070 Pain in throat: Secondary | ICD-10-CM | POA: Insufficient documentation

## 2020-08-14 DIAGNOSIS — T7840XA Allergy, unspecified, initial encounter: Secondary | ICD-10-CM | POA: Diagnosis present

## 2020-08-14 MED ORDER — PREDNISONE 20 MG PO TABS: 40.0000 mg | ORAL_TABLET | Freq: Every day | ORAL | 0 refills | Status: AC

## 2020-08-14 MED ORDER — DIPHENHYDRAMINE HCL 50 MG/ML IJ SOLN
25.0000 mg | Freq: Once | INTRAMUSCULAR | Status: AC
Start: 2020-08-14 — End: 2020-08-14
  Administered 2020-08-14: 25 mg via INTRAVENOUS
  Filled 2020-08-14: qty 1

## 2020-08-14 MED ORDER — EPINEPHRINE 0.3 MG/0.3ML IJ SOAJ: 0.3000 mg | INTRAMUSCULAR | 0 refills | Status: AC | PRN

## 2020-08-14 MED ORDER — EPINEPHRINE 0.3 MG/0.3ML IJ SOAJ
INTRAMUSCULAR | Status: AC
  Filled 2020-08-14: qty 0.3

## 2020-08-14 MED ORDER — DIPHENHYDRAMINE HCL 25 MG PO TABS: 25.0000 mg | ORAL_TABLET | ORAL | 0 refills | Status: AC | PRN

## 2020-08-14 MED ORDER — FAMOTIDINE IN NACL 20-0.9 MG/50ML-% IV SOLN
20.0000 mg | Freq: Once | INTRAVENOUS | Status: AC
  Administered 2020-08-14: 20 mg via INTRAVENOUS
  Filled 2020-08-14: qty 50

## 2020-08-14 MED ORDER — SODIUM CHLORIDE 0.9 % IV BOLUS
1000.0000 mL | Freq: Once | INTRAVENOUS | Status: AC
  Administered 2020-08-14: 1000 mL via INTRAVENOUS

## 2020-08-14 MED ORDER — METHYLPREDNISOLONE SODIUM SUCC 125 MG IJ SOLR
125.0000 mg | Freq: Once | INTRAMUSCULAR | Status: AC
  Administered 2020-08-14: 125 mg via INTRAVENOUS
  Filled 2020-08-14: qty 2

## 2020-08-14 NOTE — Discharge Instructions (Addendum)
Start your prednisone prescription tomorrow.  You need to follow-up with your primary care physician but most importantly with an allergist to figure out what is causing these allergic reactions.  If at any point you feel recurrent symptoms, you may use your EpiPen and call 911.

## 2020-08-14 NOTE — ED Notes (Addendum)
Pt reports that she cannot wait any longer. Pt wanting to leave, Dr. Lockie Mola notified. Pt signed AMA papers educated about the risks of leaving

## 2020-08-14 NOTE — ED Triage Notes (Signed)
Pt arrived in resp distress, analysis reaction. History of same but unsure what her allergy is to. Hives noted on chest, feels like throat is closing. Epi pen administered at triage.

## 2020-08-14 NOTE — ED Triage Notes (Signed)
Emergency Medicine Provider Triage Evaluation Note  Pamela Howard , a 35 y.o. female  was evaluated in triage.  Pt complains of allergic reaction. Hx anaphylaxis. About 2 hours pta started having hives, then develop tightness in her lips and throat. States it feels like she is having trouble swallowing and breathingg.  Review of Systems  Positive: Allergic rx Negative: Chest pain  Physical Exam  BP (!) 119/92 (BP Location: Left Arm)   Pulse 99   Temp 98 F (36.7 C) (Oral)   Resp (!) 32   SpO2 98%  Gen:   Awake, acute distress HEENT:  Atraumatic  Resp:  Tachypneic, no wheezing.  Cardiac:  Normal rate  Abd:   Nondistended MSK:   Moves extremities without difficulty Neuro:  Speech clear Diffuse rash, no obvious angioedema, continuously clearing throat  Medical Decision Making  Medically screening exam initiated at 3:19 PM.  Appropriate orders placed.  Gracianna Vink Gorey was informed that the remainder of the evaluation will be completed by another provider, this initial triage assessment does not replace that evaluation, and the importance of remaining in the ED until their evaluation is complete.  Clinical Impression    3:15 PM epinephrine was administered in triage. Pt reports some improvement in her throat tightness  meds ordered, pt will go directly to room.  MSE was initiated and I personally evaluated the patient and placed orders (if any) at  3:19 PM on August 14, 2020.  The patient appears stable so that the remainder of the MSE may be completed by another provider.    Karrie Meres, PA-C 08/14/20 1519

## 2020-08-14 NOTE — ED Provider Notes (Signed)
[facility] EMERGENCY DEPARTMENT Provider Note   CSN: [id number] Arrival date & time: 08/14/20  1508     History Chief Complaint  Patient presents with  . Allergic Reaction    Pamela Howard is a 35 y.o. female.  HPI 35 year old female presents with acute allergic reaction. She was eating a hamburger with multiple toppings and about 15 minutes later developed itching, rash and throat tightening.  She came straight to the ER and was given epinephrine in triage.  She denied having any tongue swelling but felt like her lips were swelling.  She states she has had allergic reactions about 3 times in the past, once requiring epinephrine.  Has never been able to find out what the causes.  She has had all of the food she ate today previously without issue.  Right now the breathing is significantly better but she still feels like a burning sensation from the rash.  She took 25 mg Benadryl p.o. before she came down.  Past Medical History:  Diagnosis Date  . Abnormal Pap smear of cervix   . Anxiety   . Genital herpes   . Gestational hypertension   . Headache    migraines  . Herpes   . Obesity     Patient Active Problem List   Diagnosis Date Noted  . Muscle spasm 06/06/2015  . Rebound headache 06/06/2015  . Migraine without aura and without status migrainosus, not intractable 06/06/2015  . Ingrown nail 03/02/2014  . Paronychia of great toe, right 03/02/2014  . Herpes 08/30/2013  . Obesity 06/01/2013  . Migraine without aura 03/30/2013  . Anxiety 03/30/2013  . Marijuana use 02/18/2013  . History of abnormal Pap smear 02/15/2013    Past Surgical History:  Procedure Laterality Date  . CESAREAN SECTION N/A 08/31/2013   Procedure: CESAREAN SECTION;  Surgeon: Willodean Rosenthal, MD;  Location: [facility];  Service: Obstetrics;  Laterality: N/A;  . WISDOM TOOTH EXTRACTION       OB History    Gravida  2   Para  2   Term  2   Preterm      AB      Living   2     SAB      IAB      Ectopic      Multiple      Live Births  2           Family History  Problem Relation Age of Onset  . Heart failure Father   . Hypertension Father   . Migraines Sister   . Migraines Paternal Grandmother     Social History   Tobacco Use  . Smoking status: Former Smoker    Types: Cigarettes  . Smokeless tobacco: Never Used  Substance Use Topics  . Alcohol use: No    Alcohol/week: 0.0 standard drinks  . Drug use: No    Home Medications Prior to Admission medications   Medication Sig Start Date End Date Taking? Authorizing Provider  diphenhydrAMINE (BENADRYL) 25 MG tablet Take 1-2 tablets (25-50 mg total) by mouth every 4 (four) hours as needed. 08/14/20  Yes Pricilla Loveless, MD  EPINEPHrine 0.3 mg/0.3 mL IJ SOAJ injection Inject 0.3 mg into the muscle as needed for anaphylaxis. 08/14/20  Yes Pricilla Loveless, MD  predniSONE (DELTASONE) 20 MG tablet Take 2 tablets (40 mg total) by mouth daily. 08/15/20  Yes Pricilla Loveless, MD  cyclobenzaprine (FLEXERIL) 10 MG tablet Take 1 tablet (10 mg  total) by mouth every 8 (eight) hours as needed for muscle spasms. Patient not taking: Reported on 09/11/2016 06/06/15   Glyn Ade, Scot Jun, PA-C  etonogestrel (NEXPLANON) 68 MG IMPL implant 1 each by Subdermal route once.    [provider]  lidocaine (LIDODERM) 5 % as needed. 05/16/14   [provider]  metroNIDAZOLE (FLAGYL) 500 MG tablet Take 1 tablet (500 mg total) by mouth 2 (two) times daily. Patient not taking: Reported on 09/11/2016 09/12/15   Allie Bossier, MD  naproxen (NAPROSYN) 500 MG tablet Take 1 tablet (500 mg total) by mouth 2 (two) times daily with a meal. Patient not taking: Reported on 09/11/2016 06/06/15   Glyn Ade, Scot Jun, PA-C  rizatriptan (MAXALT-MLT) 10 MG disintegrating tablet Take 1 tablet (10 mg total) by mouth as needed for migraine. May repeat in 2 hours if needed Patient not taking: Reported on 09/11/2016 06/06/15    Glyn Ade, Scot Jun, PA-C  topiramate (TOPAMAX) 25 MG tablet Take 3 tablets (75 mg total) by mouth daily. Patient not taking: Reported on 09/11/2016 06/06/15   Glyn Ade, Scot Jun, PA-C    Allergies    Patient has no allergy information on record.  Review of Systems   Review of Systems  HENT: Negative for facial swelling.   Respiratory: Positive for shortness of breath.   Skin: Positive for rash.  All other systems reviewed and are negative.   Physical Exam Updated Vital Signs BP (!) 136/95   Pulse 70   Temp 98 F (36.7 C) (Oral)   Resp (!) 21   SpO2 99%   Physical Exam Vitals and nursing note reviewed.  Constitutional:      Appearance: She is well-developed.  HENT:     Head: Normocephalic and atraumatic.     Right Ear: External ear normal.     Left Ear: External ear normal.     Nose: Nose normal.  Eyes:     General:        Right eye: No discharge.        Left eye: No discharge.  Cardiovascular:     Rate and Rhythm: Normal rate and regular rhythm.     Heart sounds: Normal heart sounds.  Pulmonary:     Effort: Pulmonary effort is normal.     Breath sounds: Normal breath sounds. No stridor. No wheezing.  Abdominal:     Palpations: Abdomen is soft.     Tenderness: There is no abdominal tenderness.  Skin:    General: Skin is warm and dry.     Findings: Rash (patient has diffuse scattered red rash c/w hives) present.  Neurological:     Mental Status: She is alert.  Psychiatric:        Mood and Affect: Mood is not anxious.     ED Results / Procedures / Treatments   Labs (all labs ordered are listed, but only abnormal results are displayed) Labs Reviewed - No data to display  EKG EKG Interpretation  Date/Time:  Monday August 14 2020 15:18:30 EDT Ventricular Rate:  89 PR Interval:  154 QRS Duration: 64 QT Interval:  358 QTC Calculation: 435 R Axis:   61 Text Interpretation: Normal sinus rhythm with sinus arrhythmia no acute ST/T changes No old tracing  to compare Confirmed by Pricilla Loveless [phone]) on 08/14/2020 3:23:24 PM   Radiology No results found.  Procedures Procedures   Medications Ordered in ED Medications  famotidine (PEPCID) IVPB 20 mg premix (20 mg Intravenous  New Bag/Given 08/14/20 1553)  EPINEPHrine (EPI-PEN) 0.3 mg/0.3 mL injection (  Given 08/14/20 1522)  diphenhydrAMINE (BENADRYL) injection 25 mg (25 mg Intravenous Given 08/14/20 1542)  methylPREDNISolone sodium succinate (SOLU-MEDROL) 125 mg/2 mL injection 125 mg (125 mg Intravenous Given 08/14/20 1542)  sodium chloride 0.9 % bolus 1,000 mL (1,000 mLs Intravenous New Bag/Given 08/14/20 1542)    ED Course  I have reviewed the triage vital signs and the nursing notes.  Pertinent labs & imaging results that were available during my care of the patient were reviewed by me and considered in my medical decision making (see chart for details).    MDM Rules/Calculators/A&P                          Patient feels like her throat closing symptoms/dyspnea has resolved. Rash/burning/itching is improving. Given epi in triage, we have further given benadryl, steroids, pepcid. Will need to observe. As long as she doesn't have recurrence, will need to follow up with PCP and an allergist. Care to Dr. Lockie Mola.  Final Clinical Impression(s) / ED Diagnoses Final diagnoses:  Anaphylaxis, initial encounter    Rx / DC Orders ED Discharge Orders         Ordered    predniSONE (DELTASONE) 20 MG tablet  Daily        08/14/20 1616    EPINEPHrine 0.3 mg/0.3 mL IJ SOAJ injection  As needed        08/14/20 1616    diphenhydrAMINE (BENADRYL) 25 MG tablet  Every 4 hours PRN        08/14/20 1616           Pricilla Loveless, MD 08/14/20 1619

## 2020-08-14 NOTE — ED Provider Notes (Signed)
Notified nursing staff that she wanted to leave.  I was unable to get back in the room to talk to her prior to her leaving.  She was not in any distress per nurse.  Vital signs are normal.  After she got epinephrine according to my handoff patient was very stable.  Medications have already been sent to her pharmacy as well.  She is aware of that.  Overall patient eloped.  But seem to be stable.  She was monitored for slightly over 2 hours.   Virgina Norfolk, DO 08/14/20 1739

## 2023-06-12 DIAGNOSIS — K219 Gastro-esophageal reflux disease without esophagitis: Secondary | ICD-10-CM | POA: Diagnosis not present

## 2023-06-12 DIAGNOSIS — R1013 Epigastric pain: Secondary | ICD-10-CM | POA: Diagnosis not present

## 2023-06-12 DIAGNOSIS — Z431 Encounter for attention to gastrostomy: Secondary | ICD-10-CM | POA: Diagnosis not present

## 2023-06-12 DIAGNOSIS — Z903 Acquired absence of stomach [part of]: Secondary | ICD-10-CM | POA: Diagnosis not present

## 2023-06-19 DIAGNOSIS — L600 Ingrowing nail: Secondary | ICD-10-CM | POA: Diagnosis not present

## 2023-06-19 DIAGNOSIS — E669 Obesity, unspecified: Secondary | ICD-10-CM | POA: Diagnosis not present

## 2023-06-19 DIAGNOSIS — Z202 Contact with and (suspected) exposure to infections with a predominantly sexual mode of transmission: Secondary | ICD-10-CM | POA: Diagnosis not present

## 2023-07-04 DIAGNOSIS — R634 Abnormal weight loss: Secondary | ICD-10-CM | POA: Diagnosis not present

## 2023-07-04 DIAGNOSIS — E65 Localized adiposity: Secondary | ICD-10-CM | POA: Diagnosis not present

## 2023-07-04 DIAGNOSIS — N62 Hypertrophy of breast: Secondary | ICD-10-CM | POA: Diagnosis not present

## 2023-07-04 DIAGNOSIS — M549 Dorsalgia, unspecified: Secondary | ICD-10-CM | POA: Diagnosis not present

## 2023-07-04 DIAGNOSIS — M25519 Pain in unspecified shoulder: Secondary | ICD-10-CM | POA: Diagnosis not present

## 2023-07-04 DIAGNOSIS — L304 Erythema intertrigo: Secondary | ICD-10-CM | POA: Diagnosis not present

## 2023-07-04 DIAGNOSIS — M793 Panniculitis, unspecified: Secondary | ICD-10-CM | POA: Diagnosis not present

## 2023-07-04 DIAGNOSIS — M542 Cervicalgia: Secondary | ICD-10-CM | POA: Diagnosis not present

## 2023-08-04 DIAGNOSIS — N6452 Nipple discharge: Secondary | ICD-10-CM | POA: Diagnosis not present

## 2023-08-04 DIAGNOSIS — R92323 Mammographic fibroglandular density, bilateral breasts: Secondary | ICD-10-CM | POA: Diagnosis not present

## 2023-08-21 DIAGNOSIS — Z903 Acquired absence of stomach [part of]: Secondary | ICD-10-CM | POA: Diagnosis not present

## 2023-08-21 DIAGNOSIS — E669 Obesity, unspecified: Secondary | ICD-10-CM | POA: Diagnosis not present

## 2023-08-21 DIAGNOSIS — N6452 Nipple discharge: Secondary | ICD-10-CM | POA: Diagnosis not present

## 2023-10-13 ENCOUNTER — Institutional Professional Consult (permissible substitution): Admitting: Plastic Surgery

## 2023-10-22 DIAGNOSIS — R6889 Other general symptoms and signs: Secondary | ICD-10-CM | POA: Diagnosis not present

## 2023-10-22 DIAGNOSIS — Z0000 Encounter for general adult medical examination without abnormal findings: Secondary | ICD-10-CM | POA: Diagnosis not present

## 2023-10-30 ENCOUNTER — Institutional Professional Consult (permissible substitution): Admitting: Plastic Surgery

## 2023-10-30 DIAGNOSIS — K912 Postsurgical malabsorption, not elsewhere classified: Secondary | ICD-10-CM | POA: Diagnosis not present

## 2023-10-30 DIAGNOSIS — Z903 Acquired absence of stomach [part of]: Secondary | ICD-10-CM | POA: Diagnosis not present

## 2023-11-07 ENCOUNTER — Ambulatory Visit (INDEPENDENT_AMBULATORY_CARE_PROVIDER_SITE_OTHER): Admitting: Plastic Surgery

## 2023-11-07 ENCOUNTER — Encounter: Payer: Self-pay | Admitting: Plastic Surgery

## 2023-11-07 VITALS — BP 113/77 | HR 85 | Ht 61.0 in | Wt 152.0 lb

## 2023-11-07 DIAGNOSIS — L987 Excessive and redundant skin and subcutaneous tissue: Secondary | ICD-10-CM | POA: Diagnosis not present

## 2023-11-07 DIAGNOSIS — N6481 Ptosis of breast: Secondary | ICD-10-CM

## 2023-11-07 DIAGNOSIS — Z9884 Bariatric surgery status: Secondary | ICD-10-CM

## 2023-11-07 DIAGNOSIS — M793 Panniculitis, unspecified: Secondary | ICD-10-CM

## 2023-11-07 NOTE — Progress Notes (Signed)
 Referring Provider No referring provider defined for this encounter.   CC:  Chief Complaint  Patient presents with   Advice Only   Skin Problem      Pamela Howard is an 38 y.o. female.  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  She has undergone a 100 pound weight loss after a gastric sleeve in June 2023.  She has been seen several times regarding symptomatic macromastia and ongoing rashes under the pannus.  She presents today to discuss options for breast atrophy and ptosis as well as ongoing rashes on the posterior aspect of the pannus.  No Known Allergies  Outpatient Encounter Medications as of 11/07/2023  Medication Sig Note   cyclobenzaprine  (FLEXERIL ) 10 MG tablet Take 1 tablet (10 mg total) by mouth every 8 (eight) hours as needed for muscle spasms.    diphenhydrAMINE  (BENADRYL ) 25 MG tablet Take 1-2 tablets (25-50 mg total) by mouth every 4 (four) hours as needed.    EPINEPHrine  0.3 mg/0.3 mL IJ SOAJ injection Inject 0.3 mg into the muscle as needed for anaphylaxis.    etonogestrel  (NEXPLANON ) 68 MG IMPL implant 1 each by Subdermal route once.    lidocaine  (LIDODERM ) 5 % as needed. 05/17/2015: Received from: [facility]   metroNIDAZOLE  (FLAGYL ) 500 MG tablet Take 1 tablet (500 mg total) by mouth 2 (two) times daily.    naproxen  (NAPROSYN ) 500 MG tablet Take 1 tablet (500 mg total) by mouth 2 (two) times daily with a meal.    predniSONE  (DELTASONE ) 20 MG tablet Take 2 tablets (40 mg total) by mouth daily.    rizatriptan  (MAXALT -MLT) 10 MG disintegrating tablet Take 1 tablet (10 mg total) by mouth as needed for migraine. May repeat in 2 hours if needed    topiramate  (TOPAMAX ) 25 MG tablet Take 3 tablets (75 mg total) by mouth daily.    Facility-Administered Encounter Medications as of 11/07/2023  Medication   cefTRIAXone  (ROCEPHIN ) injection 250 mg     Past Medical History:  Diagnosis Date   Abnormal Pap smear of cervix    Anxiety     Genital herpes    Gestational hypertension    Headache    migraines   Herpes    Obesity     Past Surgical History:  Procedure Laterality Date   CESAREAN SECTION N/A 08/31/2013   Procedure: CESAREAN SECTION;  Surgeon: Elveria Mungo, MD;  Location: [facility];  Service: Obstetrics;  Laterality: N/A;   WISDOM TOOTH EXTRACTION      Family History  Problem Relation Age of Onset   Heart failure Father    Hypertension Father    Migraines Sister    Migraines Paternal Grandmother     Social History   Social History Narrative   Not on file     Review of Systems General: Denies fevers, chills, weight loss CV: Denies chest pain, shortness of breath, palpitations Breast: History of greenish nipple discharge which was evaluated by general surgery.  Physical exam and mammographic screening were unremarkable.  The drainage has stopped once the her nipple piercings were removed.  Patient does endorse ongoing rashes on the posterior aspect of the breasts. Abdomen: Excess skin and fat on the anterior abdominal wall which is frequently infected with rashes.  She is able to treat this with over-the-counter medications but returns as soon as she is sweating.  She has been treated by her primary care provider for this.  She does note  that the excess skin interferes with her ability to wear clothes  Physical Exam    11/07/2023    8:49 AM 08/14/2020    5:33 PM 08/14/2020    5:00 PM  Vitals with BMI  Height 5' 1    Weight 152 lbs    BMI 28.74    Systolic 113 122   Diastolic 77 82   Pulse 85 84 78    General:  No acute distress,  Alert and oriented, Non-Toxic, Normal speech and affect Breast: Patient has ptotic atrophic breasts.  There are no masses on physical examination.  There is an area of excoriation below the right nipple.  There is no drainage from this today.  There are no rashes under her breast today. Abdomen: Patient has significant excess skin above and below the umbilicus.   There are no hernias on physical examination.  The pannus does hang below the level of the symphysis pubis.  She does have a very mild rash today which is documented in her photographs. Mammogram: April 2025 BI-RADS 2 Assessment/Plan Breast: Patient has significant atrophy and ptosis.  I do not believe that she would be a candidate for breast reduction as I could remove most 200 g.  She and I have discussed this and she is aware that she is likely no longer a candidate for reduction she would like a quote for a mastopexy.  We discussed mastopexies and I showed her location of the incisions.  We discussed the unpredictable nature of scarring and wound healing.  We discussed the risk of bleeding, infection, seroma formation.  She understands that there may be drains postoperatively if she chooses to proceed with a mastopexy.  She understands that there is a small risk of nipple loss due to nipple ischemia.  She understands that there is a very high likelihood that she will have ptosis after the surgery due to the quality of her skin.  She is requesting a quote for a mastopexy.  Abdomen: Patient has significant excess skin above and below the umbilicus.  The skin below the umbilicus hangs past the symphysis pubis.  She has a history of rashes which are only partially treated with over-the-counter and prescription medications.  I believe that she would be a good candidate for a panniculectomy.  Her weight has been stable for greater than 3 months.  I showed her the location of the incisions and again we discussed the unpredictable nature of scarring and wound healing.  We discussed the possibility that she may have skin laxity shortly after the surgery.  She understands that there will be nothing addressed above the umbilicus with a panniculectomy.  To address at the upper portion of the abdomen would be an additional add-on for the which she has requested a quote for.  I did tell her that if she was interested in  removal of her umbilicus which is quite stretched out anyways that it would be possible to remove this and remove a small amount of excess skin at the same time that her umbilicus is removed.  She will consider this and let me know at her preop appointment.  We discussed the use of drains postoperatively.  We discussed the importance of compression for 6 weeks.  We discussed the risks of bleeding, infection, and seroma formation.  She understands that there is a very high risk of wound separation requiring wound care postoperatively.  We discussed the postoperative limitations for both procedures.  These include no heavy lifting  greater than 20 pounds, no vigorous activity, no submerging incisions in water for 6 weeks.  She will be able to return to light activity as soon as she feels comfortable.  She will be encouraged to begin ambulation immediately after surgery to help decrease the risk of DVT.  All questions were answered to her satisfaction.  Photographs were obtained today with her consent.  Will schedule her for a panniculectomy at her request and provide her with quotes for an upper abdominal add-on and a mastopexy at her request.  Leonce KATHEE [place] 11/07/2023, 9:16 AM

## 2023-11-18 ENCOUNTER — Encounter: Payer: Self-pay | Admitting: Plastic Surgery

## 2023-12-19 DIAGNOSIS — Z113 Encounter for screening for infections with a predominantly sexual mode of transmission: Secondary | ICD-10-CM | POA: Diagnosis not present

## 2023-12-19 DIAGNOSIS — K912 Postsurgical malabsorption, not elsewhere classified: Secondary | ICD-10-CM | POA: Diagnosis not present

## 2023-12-19 DIAGNOSIS — Z903 Acquired absence of stomach [part of]: Secondary | ICD-10-CM | POA: Diagnosis not present

## 2023-12-25 DIAGNOSIS — N898 Other specified noninflammatory disorders of vagina: Secondary | ICD-10-CM | POA: Diagnosis not present

## 2024-01-19 DIAGNOSIS — Z23 Encounter for immunization: Secondary | ICD-10-CM | POA: Diagnosis not present

## 2024-02-05 DIAGNOSIS — Z0283 Encounter for blood-alcohol and blood-drug test: Secondary | ICD-10-CM | POA: Diagnosis not present

## 2024-03-15 ENCOUNTER — Other Ambulatory Visit (HOSPITAL_COMMUNITY): Payer: Self-pay

## 2024-03-15 DIAGNOSIS — R200 Anesthesia of skin: Secondary | ICD-10-CM | POA: Diagnosis not present

## 2024-03-15 DIAGNOSIS — M79631 Pain in right forearm: Secondary | ICD-10-CM | POA: Diagnosis not present

## 2024-03-15 DIAGNOSIS — M79632 Pain in left forearm: Secondary | ICD-10-CM | POA: Diagnosis not present

## 2024-03-15 MED ORDER — AMITRIPTYLINE HCL 10 MG PO TABS
10.0000 mg | ORAL_TABLET | Freq: Every day | ORAL | 1 refills | Status: AC
  Filled 2024-03-15: qty 30, 30d supply, fill #0

## 2024-03-15 MED ORDER — AMITRIPTYLINE HCL 10 MG PO TABS
10.0000 mg | ORAL_TABLET | Freq: Every day | ORAL | 1 refills | Status: AC
  Filled 2024-05-14: qty 30, 30d supply, fill #0

## 2024-03-15 MED ORDER — TIRZEPATIDE-WEIGHT MANAGEMENT 2.5 MG/0.5ML ~~LOC~~ SOAJ
2.5000 mg | SUBCUTANEOUS | 1 refills | Status: AC
  Filled 2024-03-15: qty 2, 28d supply, fill #0

## 2024-03-15 MED ORDER — SUMATRIPTAN SUCCINATE 50 MG PO TABS
50.0000 mg | ORAL_TABLET | ORAL | 2 refills | Status: AC
  Filled 2024-03-15: qty 18, 30d supply, fill #0
  Filled 2024-05-14: qty 18, 30d supply, fill #1

## 2024-03-15 MED ORDER — VALACYCLOVIR HCL 500 MG PO TABS
500.0000 mg | ORAL_TABLET | Freq: Every day | ORAL | 3 refills | Status: DC
  Filled 2024-03-16: qty 90, 90d supply, fill #0

## 2024-03-16 ENCOUNTER — Other Ambulatory Visit (HOSPITAL_COMMUNITY): Payer: Self-pay

## 2024-03-16 ENCOUNTER — Other Ambulatory Visit: Payer: Self-pay

## 2024-03-16 MED ORDER — SUMATRIPTAN SUCCINATE 50 MG PO TABS: 50.0000 mg | ORAL_TABLET | ORAL | 2 refills | Status: AC | PRN

## 2024-03-16 MED ORDER — ERGOCALCIFEROL 1.25 MG (50000 UT) PO CAPS
50000.0000 [IU] | ORAL_CAPSULE | ORAL | 5 refills | Status: AC
  Filled 2024-03-17 – 2024-04-16 (×4): qty 4, 28d supply, fill #0
  Filled 2024-05-14: qty 4, 28d supply, fill #1

## 2024-03-16 MED ORDER — SEMAGLUTIDE-WEIGHT MANAGEMENT 1 MG/0.5ML ~~LOC~~ SOAJ
1.0000 mg | SUBCUTANEOUS | 2 refills | Status: AC
  Filled 2024-03-16 – 2024-05-04 (×2): qty 2, 28d supply, fill #0

## 2024-03-17 ENCOUNTER — Other Ambulatory Visit (HOSPITAL_COMMUNITY): Payer: Self-pay

## 2024-03-26 ENCOUNTER — Other Ambulatory Visit (HOSPITAL_COMMUNITY): Payer: Self-pay

## 2024-03-30 ENCOUNTER — Other Ambulatory Visit (HOSPITAL_COMMUNITY): Payer: Self-pay

## 2024-03-30 MED ORDER — VOQUEZNA 20 MG PO TABS
1.0000 | ORAL_TABLET | Freq: Every day | ORAL | 11 refills | Status: AC
  Filled 2024-03-30 – 2024-03-31 (×2): qty 30, 30d supply, fill #0

## 2024-03-31 ENCOUNTER — Other Ambulatory Visit (HOSPITAL_COMMUNITY): Payer: Self-pay

## 2024-04-05 ENCOUNTER — Other Ambulatory Visit: Payer: Self-pay

## 2024-04-05 ENCOUNTER — Other Ambulatory Visit (HOSPITAL_COMMUNITY): Payer: Self-pay

## 2024-04-06 ENCOUNTER — Other Ambulatory Visit: Payer: Self-pay

## 2024-04-06 ENCOUNTER — Encounter: Payer: Self-pay | Admitting: Pharmacist

## 2024-04-07 ENCOUNTER — Other Ambulatory Visit (HOSPITAL_COMMUNITY): Payer: Self-pay

## 2024-04-09 ENCOUNTER — Other Ambulatory Visit: Payer: Self-pay

## 2024-04-15 ENCOUNTER — Other Ambulatory Visit (HOSPITAL_COMMUNITY): Payer: Self-pay

## 2024-04-15 MED ORDER — EPINEPHRINE 0.3 MG/0.3ML IJ SOAJ
INTRAMUSCULAR | 1 refills | Status: AC
  Filled 2024-04-15: qty 2, 1d supply, fill #0

## 2024-04-15 MED ORDER — AMITRIPTYLINE HCL 25 MG PO TABS
25.0000 mg | ORAL_TABLET | Freq: Every evening | ORAL | 2 refills | Status: AC
  Filled 2024-04-15: qty 30, 30d supply, fill #0

## 2024-04-16 ENCOUNTER — Other Ambulatory Visit: Payer: Self-pay

## 2024-04-16 ENCOUNTER — Other Ambulatory Visit (HOSPITAL_COMMUNITY): Payer: Self-pay

## 2024-04-19 ENCOUNTER — Other Ambulatory Visit: Payer: Self-pay

## 2024-05-04 ENCOUNTER — Other Ambulatory Visit (HOSPITAL_COMMUNITY): Payer: Self-pay

## 2024-05-05 ENCOUNTER — Other Ambulatory Visit (HOSPITAL_COMMUNITY): Payer: Self-pay

## 2024-05-05 ENCOUNTER — Other Ambulatory Visit: Payer: Self-pay

## 2024-05-05 MED ORDER — VOQUEZNA 10 MG PO TABS
10.0000 mg | ORAL_TABLET | Freq: Every day | ORAL | 11 refills | Status: AC
  Filled 2024-05-05: qty 30, 30d supply, fill #0

## 2024-05-06 ENCOUNTER — Other Ambulatory Visit (HOSPITAL_COMMUNITY): Payer: Self-pay

## 2024-05-14 ENCOUNTER — Other Ambulatory Visit: Payer: Self-pay

## 2024-05-14 ENCOUNTER — Other Ambulatory Visit (HOSPITAL_COMMUNITY): Payer: Self-pay

## 2024-05-17 ENCOUNTER — Other Ambulatory Visit (HOSPITAL_COMMUNITY): Payer: Self-pay

## 2024-05-17 MED ORDER — VALACYCLOVIR HCL 500 MG PO TABS
500.0000 mg | ORAL_TABLET | Freq: Every day | ORAL | 3 refills | Status: AC
  Filled 2024-05-17: qty 90, 90d supply, fill #0

## 2024-05-18 ENCOUNTER — Other Ambulatory Visit (HOSPITAL_COMMUNITY): Payer: Self-pay
